# Patient Record
Sex: Female | Born: 1980
Health system: Southern US, Community
[De-identification: ages and names within clinical notes are randomized; demographics above are authoritative.]

## PROBLEM LIST (undated history)

## (undated) DIAGNOSIS — R102 Pelvic and perineal pain unspecified side: Secondary | ICD-10-CM

## (undated) DIAGNOSIS — Z9889 Other specified postprocedural states: Secondary | ICD-10-CM

## (undated) DIAGNOSIS — F909 Attention-deficit hyperactivity disorder, unspecified type: Secondary | ICD-10-CM

## (undated) DIAGNOSIS — F329 Major depressive disorder, single episode, unspecified: Secondary | ICD-10-CM

## (undated) DIAGNOSIS — F419 Anxiety disorder, unspecified: Secondary | ICD-10-CM

## (undated) DIAGNOSIS — R1032 Left lower quadrant pain: Secondary | ICD-10-CM

## (undated) DIAGNOSIS — N7011 Chronic salpingitis: Secondary | ICD-10-CM

## (undated) DIAGNOSIS — N857 Hematometra: Secondary | ICD-10-CM

## (undated) DIAGNOSIS — J302 Other seasonal allergic rhinitis: Secondary | ICD-10-CM

## (undated) DIAGNOSIS — Z973 Presence of spectacles and contact lenses: Secondary | ICD-10-CM

## (undated) HISTORY — PX: LAPAROSCOPIC CHOLECYSTECTOMY: SUR755

---

## 1999-03-12 ENCOUNTER — Other Ambulatory Visit: Admission: RE | Admit: 1999-03-12 | Discharge: 1999-03-12 | Payer: Self-pay | Admitting: *Deleted

## 2000-09-15 ENCOUNTER — Other Ambulatory Visit: Admission: RE | Admit: 2000-09-15 | Discharge: 2000-09-15 | Payer: Self-pay | Admitting: *Deleted

## 2001-03-24 ENCOUNTER — Other Ambulatory Visit: Admission: RE | Admit: 2001-03-24 | Discharge: 2001-03-24 | Payer: Self-pay | Admitting: Obstetrics and Gynecology

## 2001-11-03 ENCOUNTER — Other Ambulatory Visit: Admission: RE | Admit: 2001-11-03 | Discharge: 2001-11-03 | Payer: Self-pay | Admitting: Obstetrics and Gynecology

## 2002-04-26 ENCOUNTER — Other Ambulatory Visit: Admission: RE | Admit: 2002-04-26 | Discharge: 2002-04-26 | Payer: Self-pay | Admitting: Obstetrics and Gynecology

## 2006-12-29 ENCOUNTER — Ambulatory Visit (HOSPITAL_COMMUNITY): Admission: RE | Admit: 2006-12-29 | Discharge: 2006-12-29 | Payer: Self-pay | Admitting: Obstetrics and Gynecology

## 2007-01-25 ENCOUNTER — Ambulatory Visit (HOSPITAL_COMMUNITY): Admission: RE | Admit: 2007-01-25 | Discharge: 2007-01-25 | Payer: Self-pay | Admitting: *Deleted

## 2007-03-01 ENCOUNTER — Ambulatory Visit (HOSPITAL_COMMUNITY): Admission: RE | Admit: 2007-03-01 | Discharge: 2007-03-01 | Payer: Self-pay | Admitting: *Deleted

## 2007-03-01 ENCOUNTER — Ambulatory Visit: Payer: Self-pay | Admitting: Obstetrics & Gynecology

## 2007-03-04 ENCOUNTER — Ambulatory Visit: Payer: Self-pay | Admitting: Obstetrics & Gynecology

## 2007-03-08 ENCOUNTER — Ambulatory Visit: Payer: Self-pay | Admitting: Obstetrics & Gynecology

## 2007-03-11 ENCOUNTER — Ambulatory Visit: Payer: Self-pay | Admitting: Gynecology

## 2007-03-15 ENCOUNTER — Ambulatory Visit: Payer: Self-pay | Admitting: Gynecology

## 2007-03-18 ENCOUNTER — Inpatient Hospital Stay (HOSPITAL_COMMUNITY): Admission: AD | Admit: 2007-03-18 | Discharge: 2007-03-18 | Payer: Self-pay | Admitting: Obstetrics and Gynecology

## 2007-03-22 ENCOUNTER — Ambulatory Visit: Payer: Self-pay | Admitting: Obstetrics and Gynecology

## 2007-03-25 ENCOUNTER — Ambulatory Visit: Payer: Self-pay | Admitting: Obstetrics and Gynecology

## 2007-03-29 ENCOUNTER — Ambulatory Visit: Payer: Self-pay | Admitting: Obstetrics and Gynecology

## 2007-04-01 ENCOUNTER — Ambulatory Visit: Payer: Self-pay | Admitting: Gynecology

## 2007-04-05 ENCOUNTER — Ambulatory Visit: Payer: Self-pay | Admitting: Obstetrics & Gynecology

## 2007-04-08 ENCOUNTER — Ambulatory Visit: Payer: Self-pay | Admitting: Obstetrics and Gynecology

## 2007-04-12 ENCOUNTER — Ambulatory Visit: Payer: Self-pay | Admitting: Obstetrics & Gynecology

## 2007-04-15 ENCOUNTER — Encounter (INDEPENDENT_AMBULATORY_CARE_PROVIDER_SITE_OTHER): Payer: Self-pay | Admitting: Obstetrics and Gynecology

## 2007-04-15 ENCOUNTER — Inpatient Hospital Stay (HOSPITAL_COMMUNITY): Admission: RE | Admit: 2007-04-15 | Discharge: 2007-04-18 | Payer: Self-pay | Admitting: Obstetrics and Gynecology

## 2007-05-23 ENCOUNTER — Emergency Department (HOSPITAL_COMMUNITY): Admission: EM | Admit: 2007-05-23 | Discharge: 2007-05-23 | Payer: Self-pay | Admitting: Emergency Medicine

## 2007-06-03 ENCOUNTER — Encounter (INDEPENDENT_AMBULATORY_CARE_PROVIDER_SITE_OTHER): Payer: Self-pay | Admitting: Surgery

## 2007-06-03 ENCOUNTER — Ambulatory Visit (HOSPITAL_COMMUNITY): Admission: RE | Admit: 2007-06-03 | Discharge: 2007-06-04 | Payer: Self-pay | Admitting: Surgery

## 2007-06-03 HISTORY — PX: LAPAROSCOPIC CHOLECYSTECTOMY: SUR755

## 2008-09-03 ENCOUNTER — Encounter: Admission: RE | Admit: 2008-09-03 | Discharge: 2008-09-03 | Payer: Self-pay | Admitting: Family Medicine

## 2008-11-27 ENCOUNTER — Ambulatory Visit (HOSPITAL_COMMUNITY): Admission: RE | Admit: 2008-11-27 | Discharge: 2008-11-27 | Payer: Self-pay | Admitting: Obstetrics and Gynecology

## 2009-01-10 IMAGING — RF DG CHOLANGIOGRAM OPERATIVE
1 series · 12 of 12 positions shown · non-contrast
Comparison: none

CLINICAL DATA: Gallstones.
INTRAOPERATIVE CHOLANGIOGRAM:

[Series 1: run · 6 acquisitions, 12 frames shown]
[im 1/6]
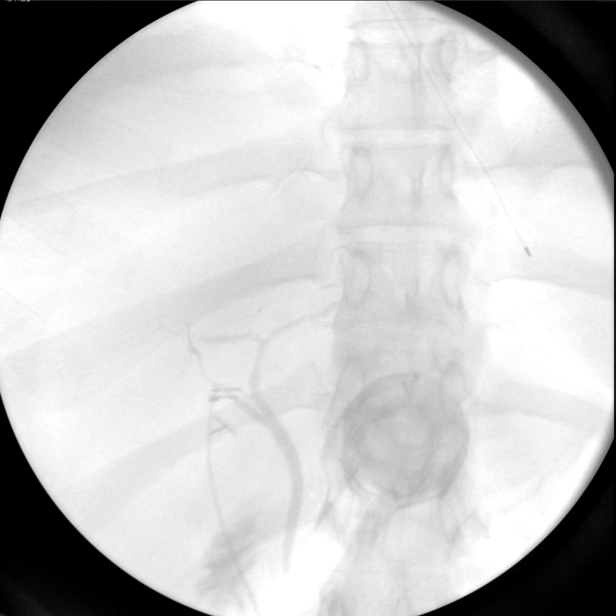
[im 1/6]
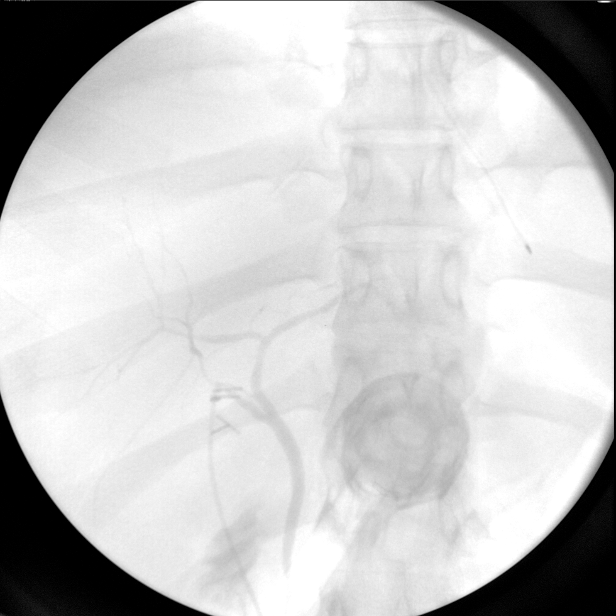
[im 1/6]
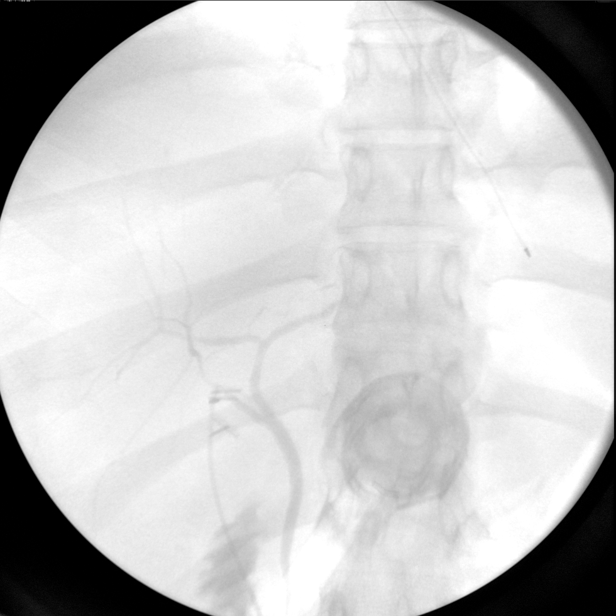
[im 1/6]
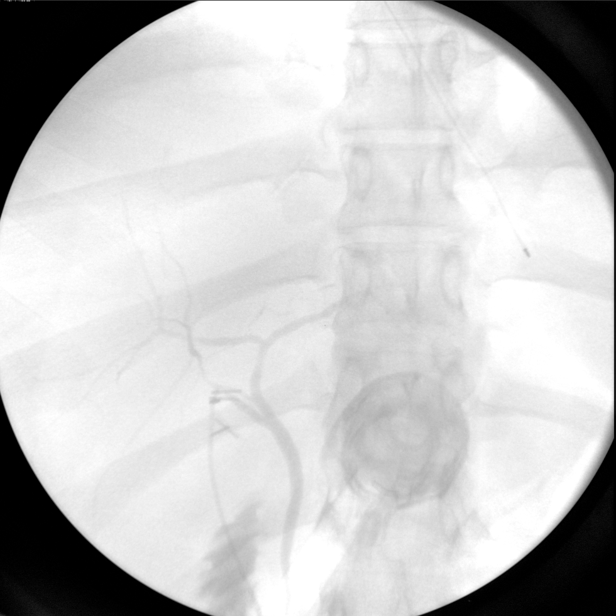
[im 2/6]
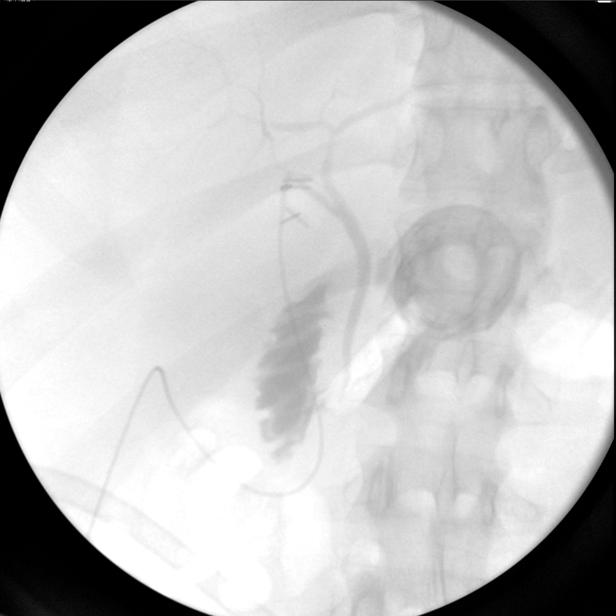
[im 2/6]
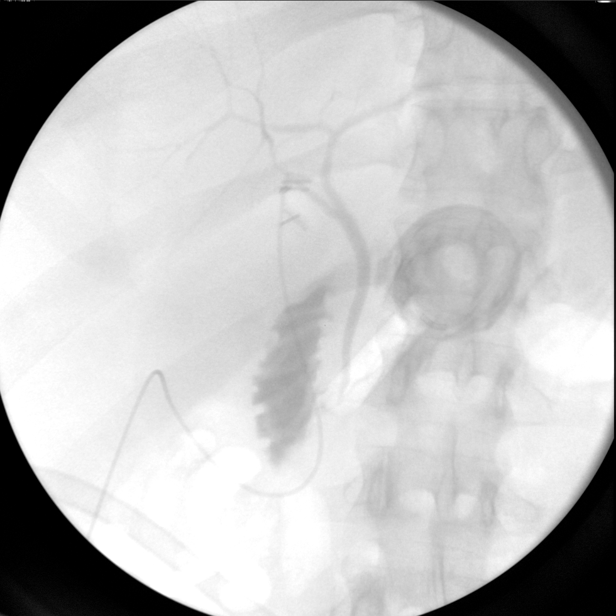
[im 2/6]
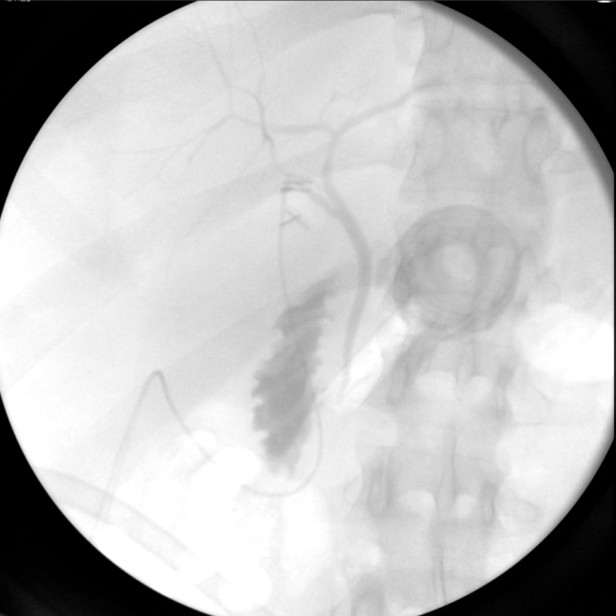
[im 2/6]
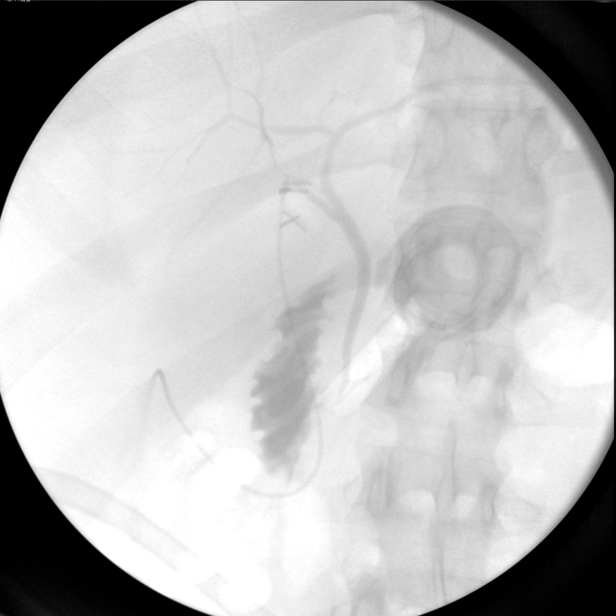
[im 3/6]
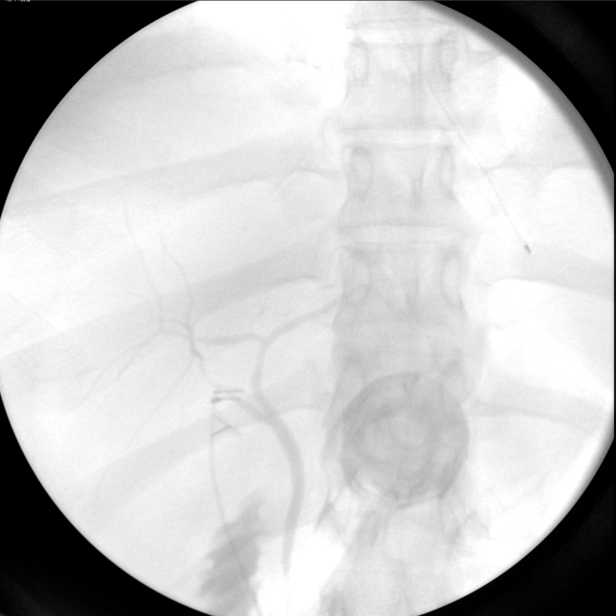
[im 4/6]
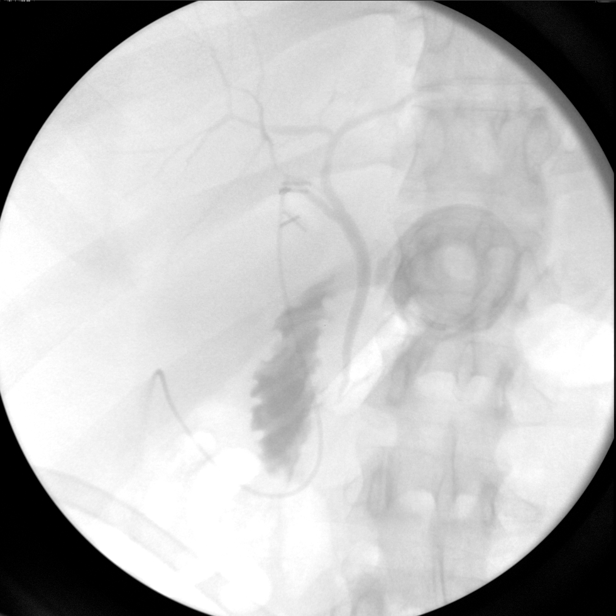
[im 5/6]
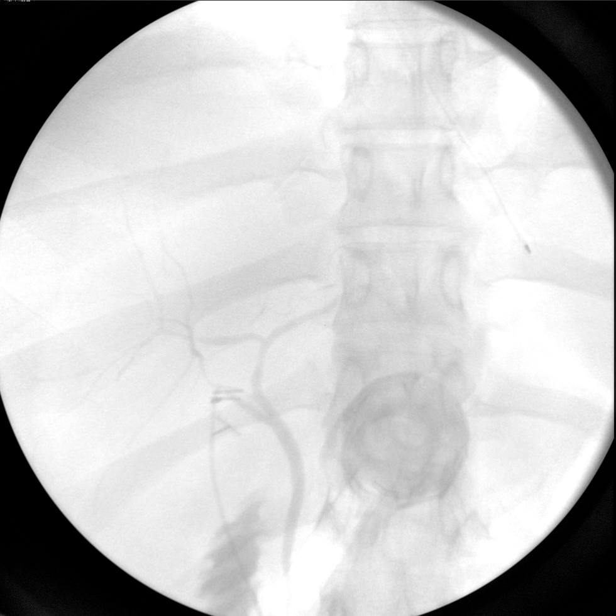
[im 6/6]
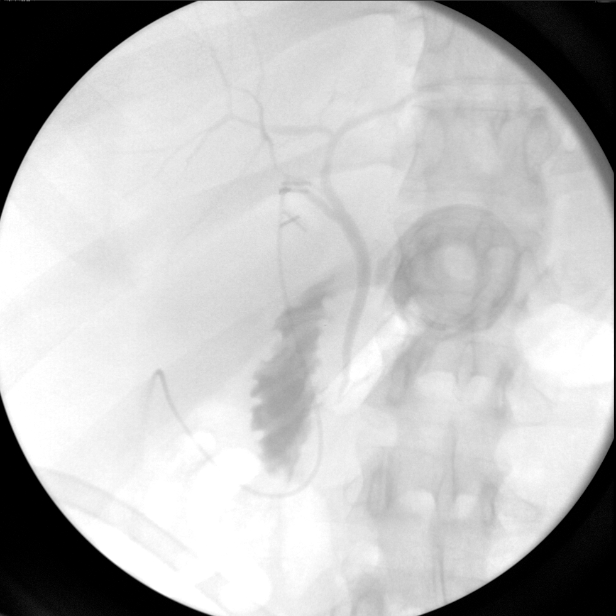

[12 of 12 positions shown; findings below may reference images not displayed]

FINDINGS: The gallbladder has been removed and the cystic duct is cannulated.  There is opacification of the intrahepatic and extrahepatic bile ducts.  There appears to be an accessory duct or small right hepatic duct that drains just proximal to the cystic duct and has small branches in the liver.  No evidence for biliary dilatation.  There is filling of the duodenum.
IMPRESSION: 1.  Patent biliary system without dilatation.
2.  Small accessory or right hepatic duct as described.

## 2009-01-15 ENCOUNTER — Ambulatory Visit (HOSPITAL_COMMUNITY): Admission: RE | Admit: 2009-01-15 | Discharge: 2009-01-15 | Payer: Self-pay | Admitting: Obstetrics and Gynecology

## 2009-02-12 ENCOUNTER — Ambulatory Visit (HOSPITAL_COMMUNITY): Admission: RE | Admit: 2009-02-12 | Discharge: 2009-02-12 | Payer: Self-pay | Admitting: Obstetrics and Gynecology

## 2009-04-02 ENCOUNTER — Encounter (INDEPENDENT_AMBULATORY_CARE_PROVIDER_SITE_OTHER): Payer: Self-pay | Admitting: Obstetrics and Gynecology

## 2009-04-02 ENCOUNTER — Inpatient Hospital Stay (HOSPITAL_COMMUNITY): Admission: RE | Admit: 2009-04-02 | Discharge: 2009-04-05 | Payer: Self-pay | Admitting: Obstetrics and Gynecology

## 2010-04-14 ENCOUNTER — Encounter: Payer: Self-pay | Admitting: Obstetrics and Gynecology

## 2010-06-08 LAB — CBC
HCT: 29.7 % — ABNORMAL LOW (ref 36.0–46.0)
HCT: 36.5 % (ref 36.0–46.0)
Hemoglobin: 10.2 g/dL — ABNORMAL LOW (ref 12.0–15.0)
Hemoglobin: 12.5 g/dL (ref 12.0–15.0)
MCHC: 34.3 g/dL (ref 30.0–36.0)
MCV: 94.8 fL (ref 78.0–100.0)
MCV: 94.9 fL (ref 78.0–100.0)
Platelets: 157 10*3/uL (ref 150–400)
RBC: 3.85 MIL/uL — ABNORMAL LOW (ref 3.87–5.11)
RDW: 13.1 % (ref 11.5–15.5)
WBC: 6.8 10*3/uL (ref 4.0–10.5)

## 2010-08-05 NOTE — Op Note (Signed)
NAMEMAYDELL, KNOEBEL             ACCOUNT NO.:  [id number]   MEDICAL RECORD NO.:  [id number]          PATIENT TYPE:  INP   LOCATION:  9135                          FACILITY:  [facility]   PHYSICIAN:  Lenoard Aden, M.D.DATE OF BIRTH:  May 18, 1980   DATE OF PROCEDURE:  04/15/2007  DATE OF DISCHARGE:                               OPERATIVE REPORT   PREOPERATIVE DIAGNOSES:  1. Twins.  2. At 37 weeks.  3. Malpresentation twin A.  4. Discordant growth.   POSTOPERATIVE DIAGNOSES:  1. Twins.  2. At 37 weeks.  3. Malpresentation twin A.  4. Discordant growth.   PROCEDURE:  Primary low segment transverse cesarean section.   SURGEON:  Lenoard Aden, M.D.   ASSISTANT:  Marlinda Mike, C.N.M.   ANESTHESIA:  Spinal by Quillian Quince, M.D.   ESTIMATED BLOOD LOSS:  1000 mL.   COMPLICATIONS:  None.   DRAIN:  Foley.   COUNTS:  Correct.   Patient to recovery in good condition.   FINDINGS:  Full-term living female twin A breech, B transverse, anterior  and posterior placentas noted.  Apgars 7 and 9 A and B.  Two-layer  closure.  Normal tubes, normal ovaries.   BRIEF OPERATIVE NOTE:  After being apprised of the risks of anesthesia,  infection, bleeding, injury to abdominal organs with need for repair,  delayed versus immediate complications to include bowel and bladder  injury, patient is brought to the operating room where she was  administered spinal anesthetic without complications, prepped and draped  in usual sterile fashion.  Foley catheter placed after achieving  adequate anesthesia, dilute Marcaine solution placed.  Pfannenstiel skin  incision made with scalpel, carried down to the fascia which was nicked  in the midline of the transverse using Mayo scissors.  Rectus muscles  dissected sharply in the midline, peritoneum entered sharply.  Bladder  blade placed.  Visceral peritoneum scored sharply off the lower uterine  segment.  Kerr hysterotomy incision made.  Amniotomy  with clear fluid.  Atraumatic delivery of twin A female from the breech position in the  right lower quadrant, handed pediatricians in attendance.  Apgars 7 and  7.  Cord blood collected.  Amniotomy clear, B delivered from a footling  breech position, Apgars 7 and 9.  Pediatricians in attendance.  Cord  blood collected.  Bulb suctioning performed on both babies.  Placentas  delivered intact three-vessel cord.  Uterus curetted using a dry lap  pack, exteriorized and closed in two running layers of 0 Monocryl  suture.  Good hemostasis is noted.  Bladder flap inspected and found to  be hemostatic.  Irrigation accomplished.  Fascia closed using 0 Monocryl  in continuous running fashion.  Subcutaneous tissue reapproximated using  2-0 plain.  Skin closed using staples.  The patient tolerated procedure  well and was transferred to recovery in good condition.      Lenoard Aden, M.D.  Electronically Signed     RJT/MEDQ  D:  04/15/2007  T:  04/15/2007  Job:  [id number]

## 2010-08-05 NOTE — Op Note (Signed)
NAMEJALIYA, SIEGMANN             ACCOUNT NO.:  [id number]   MEDICAL RECORD NO.:  [id number]          PATIENT TYPE:  INP   LOCATION:  5118                         FACILITY:  [facility]   PHYSICIAN:  Currie Paris, M.D.DATE OF BIRTH:  04-19-1980   DATE OF PROCEDURE:  06/03/2007  DATE OF DISCHARGE:  06/04/2007                               OPERATIVE REPORT   CCS [id number].   PREOPERATIVE DIAGNOSIS:  Chronic calculus cholecystitis.   POSTOPERATIVE DIAGNOSIS:  Chronic calculus cholecystitis with finding of  accessory right hepatic duct draining into cystic duct.   OPERATION:  Laparoscopic cholecystectomy with operative cholangiogram.   SURGEON:  Dr. Jamey Ripa   ASSISTANT:  Dr. Lurene Shadow   ANESTHESIA:  General endotracheal.   CLINICAL HISTORY:  This lady is 4 weeks status post delivery of twins  who has began having biliary symptoms and was found to have gallstones.  She elected to proceed to cholecystectomy.   DESCRIPTION OF PROCEDURE:  The patient was seen in the holding area, and  she had no further questions.  We confirmed that cholecystectomy was the  planned procedure.   She was taken to the operating room and after satisfactory general  endotracheal anesthesia had been obtained, the abdomen was prepped and  draped.  The time-out was performed.   Marcaine 0.25% plain was used for each incision.  The umbilical incision  was made, the fascia identified and opened and the peritoneal cavity  entered under direct vision.  The Hasson was introduced and the abdomen  insufflated to 15.  There were no gross abnormalities noted.  There were  a few adhesions on the gallbladder.   A 10/11 trocar was placed in the epigastrium, two 5 mm laterally.  The  gallbladder was retracted up over the liver, and I began dissection  around the cystic duct and opened the peritoneum and found a long  segment of cystic duct and also could see the cystic artery coming up  along side and almost attached,  but I was able to dissect that off so we  had a nice segment of cystic duct.  Initially, I clipped the cystic  artery once and then the cystic duct at its junction with the  gallbladder.  I placed a Cook catheter percutaneously and then opened  the cystic duct and threaded the Cook catheter in and held with a single  clip.   The cholangiogram was done and showed good filling of the common duct  and duodenum and hepatic radicals.  However, there appeared to be an  accessory duct coming off of the cystic duct into a small segment of the  right lobe of the liver.  Once I had the cholangiogram done, I went  ahead and removed the catheter and closely inspected the cystic duct and  found what appeared to be a ductal-looking structure that was extremely  tiny coming into the cystic duct right where I had cut it and not  leaving me enough room really to get a good clip across to occlude the  cystic duct.  I therefore clipped the cystic duct in a more normal  position and then clipped this accessory duct, hoping that it would then  close off.   The gallbladder was then checked and saw no other abnormalities.  I put  some more clips on the cystic artery and divided it and removed the  gallbladder from below to above with additional clips being placed in  what looked like posterior cystic artery.  Once the gallbladder was  removed, I irrigated and made sure everything was dry.  I put the  gallbladder in a bag and brought it out through the umbilical port.  I  took another look in the area of the cystic duct and saw no evidence of  either bleeding or bile leaks but because of this abnormal anatomy and  the accessory duct, I elected to put a drain in in case there was a  postoperative bile leak.   This was done by placing a 19 Blake through the epigastric port and  bringing it out the lateral site and placing it along the patch Douglas  and posterior to the area where I thought the leak could  occur if one  did.   Once everything had been irrigated, I removed the lateral ports.  I  closed the umbilical port with the pursestring.  I deflated the abdomen  through the epigastric port and then closed all skin with 4-0 Monocryl  subcuticular and Dermabond.   The patient tolerated the procedure well.  There were no operative  complications.  All counts were correct.      Currie Paris, M.D.  Electronically Signed     CJS/MEDQ  D:  06/03/2007  T:  06/04/2007  Job:  [id number]   cc:   Lenoard Aden, M.D.

## 2010-08-05 NOTE — Discharge Summary (Signed)
NAMEMORRISON, MASSER             ACCOUNT NO.:  [id number]   MEDICAL RECORD NO.:  [id number]          PATIENT TYPE:  INP   LOCATION:  9135                          FACILITY:  [facility]   PHYSICIAN:  Lenoard Aden, M.D.DATE OF BIRTH:  March 31, 1980   DATE OF ADMISSION:  04/15/2007  DATE OF DISCHARGE:  04/18/2007                               DISCHARGE SUMMARY   HISTORY OF PRESENT ILLNESS:  The patient underwent uncomplicated primary  C-section for twins with malpresentation of twin A at 37 weeks with  discordant growth.  Postoperative course uncomplicated.  Tolerating diet  well.  Discharged to home day 3.   DISCHARGE INSTRUCTIONS:  Discharge teaching was done.   DISCHARGE MEDICATIONS:  Tylox, prenatal vitamins, and iron given.   FOLLOWUP:  Follow up in the office in 4-6 weeks.      Lenoard Aden, M.D.  Electronically Signed     RJT/MEDQ  D:  06/03/2007  T:  06/04/2007  Job:  [id number]

## 2010-12-11 LAB — RPR: RPR Ser Ql: NONREACTIVE

## 2010-12-11 LAB — CBC
Hemoglobin: 9.8 — ABNORMAL LOW
MCHC: 34.8
MCV: 94.8
Platelets: 161
RBC: 3.64 — ABNORMAL LOW
RDW: 14.3
RDW: 14.7

## 2010-12-15 LAB — DIFFERENTIAL
Basophils Absolute: 0
Basophils Relative: 0
Basophils Relative: 1
Eosinophils Relative: 2
Eosinophils Relative: 3
Lymphs Abs: 1.3
Monocytes Absolute: 0.3
Neutro Abs: 2.3
Neutrophils Relative %: 69

## 2010-12-15 LAB — CBC
HCT: 34.2 — ABNORMAL LOW
HCT: 36.6
Hemoglobin: 11.8 — ABNORMAL LOW
MCHC: 34.5
MCHC: 35.4
MCV: 89.4
Platelets: 166
Platelets: 196
RBC: 4.12
RDW: 13.3
RDW: 13.3
WBC: 4.3

## 2010-12-15 LAB — COMPREHENSIVE METABOLIC PANEL
AST: 25
Albumin: 3.8
Albumin: 3.8
Alkaline Phosphatase: 58
Alkaline Phosphatase: 70
BUN: 14
BUN: 6
BUN: 8
CO2: 25
Calcium: 8.9
Chloride: 106
Chloride: 112
GFR calc Af Amer: >60
Glucose, Bld: 110 — ABNORMAL HIGH
Glucose, Bld: 112 — ABNORMAL HIGH
Potassium: 3.9
Potassium: 4.1
Potassium: 4.1
Total Bilirubin: 0.4
Total Protein: 5.5 — ABNORMAL LOW
Total Protein: 6.5

## 2010-12-15 LAB — URINALYSIS, ROUTINE W REFLEX MICROSCOPIC
Bilirubin Urine: NEGATIVE
Glucose, UA: NEGATIVE
Ketones, ur: NEGATIVE
Ketones, ur: NEGATIVE
Nitrite: NEGATIVE
Nitrite: NEGATIVE
Protein, ur: NEGATIVE
Urobilinogen, UA: 0.2
Urobilinogen, UA: 0.2

## 2010-12-15 LAB — POCT PREGNANCY, URINE: Operator id: 277751

## 2010-12-15 LAB — URINE MICROSCOPIC-ADD ON

## 2011-06-02 HISTORY — PX: HYSTEROSCOPY W/ ENDOMETRIAL ABLATION: SUR665

## 2012-11-26 ENCOUNTER — Emergency Department (INDEPENDENT_AMBULATORY_CARE_PROVIDER_SITE_OTHER): Payer: BC Managed Care – PPO

## 2012-11-26 ENCOUNTER — Encounter (HOSPITAL_COMMUNITY): Payer: Self-pay | Admitting: *Deleted

## 2012-11-26 ENCOUNTER — Emergency Department (HOSPITAL_COMMUNITY)
Admission: EM | Admit: 2012-11-26 | Discharge: 2012-11-26 | Disposition: A | Discharge status: Home / Self Care | Payer: BC Managed Care – PPO | Source: Home / Self Care | Attending: Family Medicine | Admitting: Family Medicine

## 2012-11-26 DIAGNOSIS — S93601A Unspecified sprain of right foot, initial encounter: Secondary | ICD-10-CM

## 2012-11-26 DIAGNOSIS — S93609A Unspecified sprain of unspecified foot, initial encounter: Secondary | ICD-10-CM

## 2012-11-26 MED ORDER — HYDROCODONE-ACETAMINOPHEN 5-325 MG PO TABS: 1.0000 | ORAL_TABLET | Freq: Four times a day (QID) | ORAL | Status: DC | PRN

## 2012-11-26 NOTE — ED Provider Notes (Signed)
CSN: [id number]     Arrival date & time 11/26/12  1843 History   First MD Initiated Contact with Patient 11/26/12 1859     Chief Complaint  Patient presents with  . Foot Injury   (Consider location/radiation/quality/duration/timing/severity/associated sxs/prior Treatment) Patient is a 32 y.o. female presenting with foot injury. The history is provided by the patient.  Foot Injury Location:  Foot Time since incident:  8 hours Injury: yes   Mechanism of injury comment:  Running after kids and felt pop in right foot plantar surface. Foot location:  Sole of R foot Pain details:    Quality:  Burning   Radiates to:  Does not radiate   Severity:  Moderate   Onset quality:  Sudden   Progression:  Unchanged Chronicity:  New Dislocation: no   Tetanus status:  Out of date Prior injury to area:  No   History reviewed. No pertinent past medical history. History reviewed. No pertinent past surgical history. History reviewed. No pertinent family history. History  Substance Use Topics  . Smoking status: Current Every Day Smoker  . Smokeless tobacco: Not on file  . Alcohol Use: No   OB History   Grav Para Term Preterm Abortions TAB SAB Ect Mult Living                 Review of Systems  Constitutional: Negative.   Musculoskeletal: Positive for gait problem. Negative for joint swelling.    Allergies  Review of patient's allergies indicates no known allergies.  Home Medications  No current outpatient prescriptions on file. BP 124/70  Pulse 72  Temp(Src) 98.6 F (37 C) (Oral)  Resp 16  SpO2 100% Physical Exam  Nursing note and vitals reviewed. Constitutional: She is oriented to person, place, and time. She appears well-developed and well-nourished.  Musculoskeletal: She exhibits tenderness.       Right foot: She exhibits tenderness. She exhibits no bony tenderness, no swelling, normal capillary refill and no crepitus.       Feet:  Neurological: She is alert and oriented to  person, place, and time.  Skin: Skin is warm and dry.    ED Course  Procedures (including critical care time) Labs Review Labs Reviewed - No data to display Imaging Review No results found.  MDM      Linna Hoff, MD 11/26/12 [phone]

## 2012-11-26 NOTE — ED Notes (Signed)
Pt  Reports  Injured  Her  l  Foot  Playing  Soccer  Today        She    Reports   She  Felt a  Pop    -  She  Has  Pain  Which  Is  Worse  On  Weight  Bearing

## 2012-12-23 ENCOUNTER — Other Ambulatory Visit: Payer: Self-pay | Admitting: Dermatology

## 2013-03-23 HISTORY — PX: KNEE ARTHROSCOPY W/ ACL RECONSTRUCTION: SHX1858

## 2014-04-10 ENCOUNTER — Other Ambulatory Visit: Payer: Self-pay | Admitting: Orthopedic Surgery

## 2014-04-10 DIAGNOSIS — M25511 Pain in right shoulder: Secondary | ICD-10-CM

## 2014-04-21 ENCOUNTER — Ambulatory Visit
Admission: RE | Admit: 2014-04-21 | Discharge: 2014-04-21 | Disposition: A | Discharge status: Home / Self Care | Payer: 59 | Source: Ambulatory Visit | Attending: Orthopedic Surgery | Admitting: Orthopedic Surgery

## 2014-04-21 DIAGNOSIS — M25511 Pain in right shoulder: Secondary | ICD-10-CM

## 2016-04-01 DIAGNOSIS — Z79899 Other long term (current) drug therapy: Secondary | ICD-10-CM | POA: Diagnosis not present

## 2016-07-03 DIAGNOSIS — Z79899 Other long term (current) drug therapy: Secondary | ICD-10-CM | POA: Diagnosis not present

## 2016-10-28 DIAGNOSIS — Z79899 Other long term (current) drug therapy: Secondary | ICD-10-CM | POA: Diagnosis not present

## 2016-11-24 DIAGNOSIS — J069 Acute upper respiratory infection, unspecified: Secondary | ICD-10-CM | POA: Diagnosis not present

## 2016-12-02 DIAGNOSIS — H6690 Otitis media, unspecified, unspecified ear: Secondary | ICD-10-CM | POA: Diagnosis not present

## 2016-12-02 DIAGNOSIS — J45909 Unspecified asthma, uncomplicated: Secondary | ICD-10-CM | POA: Diagnosis not present

## 2018-05-23 ENCOUNTER — Encounter (HOSPITAL_COMMUNITY): Payer: Self-pay | Admitting: Emergency Medicine

## 2018-05-23 ENCOUNTER — Emergency Department (HOSPITAL_COMMUNITY): Payer: 59

## 2018-05-23 ENCOUNTER — Other Ambulatory Visit: Payer: Self-pay

## 2018-05-23 ENCOUNTER — Emergency Department (HOSPITAL_COMMUNITY)
Admission: EM | Admit: 2018-05-23 | Discharge: 2018-05-23 | Disposition: A | Discharge status: Home / Self Care | Payer: 59 | Attending: Emergency Medicine | Admitting: Emergency Medicine

## 2018-05-23 ENCOUNTER — Other Ambulatory Visit: Payer: Self-pay | Admitting: Certified Nurse Midwife

## 2018-05-23 DIAGNOSIS — F17210 Nicotine dependence, cigarettes, uncomplicated: Secondary | ICD-10-CM | POA: Diagnosis not present

## 2018-05-23 DIAGNOSIS — R109 Unspecified abdominal pain: Secondary | ICD-10-CM | POA: Insufficient documentation

## 2018-05-23 DIAGNOSIS — N83201 Unspecified ovarian cyst, right side: Secondary | ICD-10-CM | POA: Diagnosis not present

## 2018-05-23 LAB — BASIC METABOLIC PANEL
ANION GAP: 5 (ref 5–15)
BUN: 9 mg/dL (ref 6–20)
CALCIUM: 8.8 mg/dL — AB (ref 8.9–10.3)
CHLORIDE: 110 mmol/L (ref 98–111)
CO2: 24 mmol/L (ref 22–32)
Creatinine, Ser: 0.8 mg/dL (ref 0.44–1.00)
GFR calc non Af Amer: >60 mL/min (ref >60)
Glucose, Bld: 105 mg/dL — ABNORMAL HIGH (ref 70–99)
POTASSIUM: 4.5 mmol/L (ref 3.5–5.1)
Sodium: 139 mmol/L (ref 135–145)

## 2018-05-23 LAB — CBC WITH DIFFERENTIAL/PLATELET
ABS IMMATURE GRANULOCYTES: 0.02 10*3/uL (ref 0.00–0.07)
BASOS ABS: 0.1 10*3/uL (ref 0.0–0.1)
Basophils Relative: 1 %
EOS ABS: 0.1 10*3/uL (ref 0.0–0.5)
Eosinophils Relative: 2 %
HEMATOCRIT: 44.1 % (ref 36.0–46.0)
Hemoglobin: 14.4 g/dL (ref 12.0–15.0)
IMMATURE GRANULOCYTES: 0 %
LYMPHS PCT: 25 %
Lymphs Abs: 1.5 10*3/uL (ref 0.7–4.0)
MCH: 30.5 pg (ref 26.0–34.0)
MCHC: 32.7 g/dL (ref 30.0–36.0)
MCV: 93.4 fL (ref 80.0–100.0)
Monocytes Absolute: 0.4 10*3/uL (ref 0.1–1.0)
Monocytes Relative: 6 %
NEUTROS ABS: 4 10*3/uL (ref 1.7–7.7)
NEUTROS PCT: 66 %
NRBC: 0 % (ref 0.0–0.2)
PLATELETS: 176 10*3/uL (ref 150–400)
RBC: 4.72 MIL/uL (ref 3.87–5.11)
RDW: 12.2 % (ref 11.5–15.5)
WBC: 6.1 10*3/uL (ref 4.0–10.5)

## 2018-05-23 LAB — URINALYSIS, ROUTINE W REFLEX MICROSCOPIC
Bilirubin Urine: NEGATIVE
GLUCOSE, UA: NEGATIVE mg/dL
Ketones, ur: NEGATIVE mg/dL
Leukocytes,Ua: NEGATIVE
NITRITE: NEGATIVE
PH: 7 (ref 5.0–8.0)
Protein, ur: NEGATIVE mg/dL
Specific Gravity, Urine: 1.011 (ref 1.005–1.030)

## 2018-05-23 LAB — I-STAT BETA HCG BLOOD, ED (MC, WL, AP ONLY): I-stat hCG, quantitative: <5 m[IU]/mL (ref <5)

## 2018-05-23 MED ORDER — KETOROLAC TROMETHAMINE 30 MG/ML IJ SOLN
30.0000 mg | Freq: Once | INTRAMUSCULAR | Status: AC
Start: 2018-05-23 — End: 2018-05-23
  Administered 2018-05-23: 30 mg via INTRAVENOUS
  Filled 2018-05-23: qty 1

## 2018-05-23 MED ORDER — METHOCARBAMOL 500 MG PO TABS: 1000.0000 mg | ORAL_TABLET | Freq: Four times a day (QID) | ORAL | 0 refills | Status: DC | PRN

## 2018-05-23 MED ORDER — MORPHINE SULFATE (PF) 4 MG/ML IV SOLN
4.0000 mg | INTRAVENOUS | Status: AC | PRN
  Administered 2018-05-23 (×2): 4 mg via INTRAVENOUS
  Filled 2018-05-23 (×2): qty 1

## 2018-05-23 MED ORDER — FENTANYL CITRATE (PF) 100 MCG/2ML IJ SOLN
50.0000 ug | INTRAMUSCULAR | Status: AC | PRN
  Administered 2018-05-23 (×2): 50 ug via INTRAVENOUS
  Filled 2018-05-23 (×2): qty 2

## 2018-05-23 MED ORDER — ONDANSETRON HCL 4 MG/2ML IJ SOLN
4.0000 mg | INTRAMUSCULAR | Status: DC | PRN
  Administered 2018-05-23: 4 mg via INTRAVENOUS
  Filled 2018-05-23: qty 2

## 2018-05-23 MED ORDER — CEPHALEXIN 500 MG PO CAPS: 500.0000 mg | ORAL_CAPSULE | Freq: Four times a day (QID) | ORAL | 0 refills | Status: DC

## 2018-05-23 MED ORDER — OXYCODONE-ACETAMINOPHEN 5-325 MG PO TABS: ORAL_TABLET | ORAL | 0 refills | Status: DC

## 2018-05-23 MED ORDER — OXYCODONE-ACETAMINOPHEN 5-325 MG PO TABS
2.0000 | ORAL_TABLET | Freq: Once | ORAL | Status: AC
  Administered 2018-05-23: 2 via ORAL
  Filled 2018-05-23: qty 2

## 2018-05-23 MED ORDER — METHOCARBAMOL 500 MG PO TABS
1000.0000 mg | ORAL_TABLET | Freq: Once | ORAL | Status: AC
  Administered 2018-05-23: 1000 mg via ORAL
  Filled 2018-05-23: qty 2

## 2018-05-23 MED ORDER — FENTANYL CITRATE (PF) 100 MCG/2ML IJ SOLN
100.0000 ug | INTRAMUSCULAR | Status: DC | PRN
  Filled 2018-05-23: qty 2

## 2018-05-23 NOTE — ED Notes (Signed)
US at bedside

## 2018-05-23 NOTE — ED Triage Notes (Signed)
Pt c/o left flank pain that radiates around to left lower abd that started this morning. Pt denies n/v/d or urinary problems.

## 2018-05-23 NOTE — Discharge Instructions (Signed)
Your pelvic ultrasound showed an incidental finding(s): "1) Focal fluid collection within the LEFT cornual region, possibly representing entrapped endometrial fluid from prior ablation or synechiae. 2) 2.1 centimeters simple RIGHT ovarian cyst. 3) LEFT hydrosalpinx.  LEFT ovary is normal." Take the prescriptions as directed.  Apply moist heat or ice to the area(s) of discomfort, for 15 minutes at a time, several times per day for the next few days.  Do not fall asleep on a heating or ice pack.  Call your regular medical doctor and OB/GYN doctor today to schedule a follow up appointment this week.  Return to the Emergency Department immediately if worsening.

## 2018-05-23 NOTE — ED Provider Notes (Signed)
[facility] DEPT Provider Note   CSN: [id number] Arrival date & time: 05/23/18  3419    History   Chief Complaint Chief Complaint  Patient presents with  . Flank Pain  . Abdominal Pain    HPI Wendy Hicks is a 38 y.o. female.     HPI  Pt was seen at [place]. Per pt, c/o sudden onset and persistence of waxing and waning left sided flank "pain" that began yesterday morning, constant since 0300 this morning.  Pt describes the pain as radiating into the left side of her abd.  Has been associated with no other symptoms. Denies vaginal bleeding/discharge, no dysuria/hematuria, no abd pain, no N/V, no diarrhea, no CP/SOB, no fevers, no rash.    History reviewed. No pertinent past medical history.  There are no active problems to display for this patient.   History reviewed. No pertinent surgical history.   OB History   No obstetric history on file.      Home Medications    Prior to Admission medications   Medication Sig Start Date End Date Taking? Authorizing Provider  HYDROcodone-acetaminophen (NORCO/VICODIN) 5-325 MG per tablet Take 1 tablet by mouth every 6 (six) hours as needed for pain. 11/26/12   Billy Fischer, MD    Family History No family history on file.  Social History Social History   Tobacco Use  . Smoking status: Current Every Day Smoker    Types: Cigarettes  Substance Use Topics  . Alcohol use: No  . Drug use: Not on file     Allergies   Patient has no known allergies.   Review of Systems Review of Systems ROS: Statement: All systems negative except as marked or noted in the HPI; Constitutional: Negative for fever and chills. ; ; Eyes: Negative for eye pain, redness and discharge. ; ; ENMT: Negative for ear pain, hoarseness, nasal congestion, sinus pressure and sore throat. ; ; Cardiovascular: Negative for chest pain, palpitations, diaphoresis, dyspnea and peripheral edema. ; ; Respiratory: Negative for cough,  wheezing and stridor. ; ; Gastrointestinal: Negative for nausea, vomiting, diarrhea, abdominal pain, blood in stool, hematemesis, jaundice and rectal bleeding. . ; ; Genitourinary: +flank pain. Negative for dysuria and hematuria. ; ; GYN:  No pelvic pain, no vaginal bleeding, no vaginal discharge, no vulvar pain. ;; Musculoskeletal: Negative for back pain and neck pain. Negative for swelling and trauma.; ; Skin: Negative for pruritus, rash, abrasions, blisters, bruising and skin lesion.; ; Neuro: Negative for headache, lightheadedness and neck stiffness. Negative for weakness, altered level of consciousness, altered mental status, extremity weakness, paresthesias, involuntary movement, seizure and syncope.       Physical Exam Updated Vital Signs BP 137/82 (BP Location: Right Arm)   Pulse 100   Temp 98.1 F (36.7 C) (Oral)   Resp 20   Ht 5\' 7"  (1.702 m)   Wt 90.7 kg   SpO2 100%   BMI 31.32 kg/m   Physical Exam 0820: Physical examination:  Nursing notes reviewed; Vital signs and O2 SAT reviewed;  Constitutional: Well developed, Well nourished, Well hydrated, Uncomfortable appearing.; Head:  Normocephalic, atraumatic; Eyes: EOMI, PERRL, No scleral icterus; ENMT: Mouth and pharynx normal, Mucous membranes moist; Neck: Supple, Full range of motion, No lymphadenopathy; Cardiovascular: Regular rate and rhythm, No gallop; Respiratory: Breath sounds clear & equal bilaterally, No wheezes.  Speaking full sentences with ease, Normal respiratory effort/excursion; Chest: Nontender, Movement normal; Abdomen: Soft, Nontender, Nondistended, Normal bowel sounds; Genitourinary: No CVA tenderness; Extremities:  Peripheral pulses normal, No tenderness, No edema, No calf edema or asymmetry.; Neuro: AA&Ox3, Major CN grossly intact.  Speech clear. No gross focal motor or sensory deficits in extremities.; Skin: Color normal, Warm, Dry.   ED Treatments / Results  Labs (all labs ordered are listed, but only abnormal  results are displayed)   EKG None  Radiology   Procedures Procedures (including critical care time)  Medications Ordered in ED Medications  morphine 4 MG/ML injection 4 mg (has no administration in time range)  ondansetron (ZOFRAN) injection 4 mg (has no administration in time range)     Initial Impression / Assessment and Plan / ED Course  I have reviewed the triage vital signs and the nursing notes.  Pertinent labs & imaging results that were available during my care of the patient were reviewed by me and considered in my medical decision making (see chart for details).     MDM Reviewed: previous chart, nursing note and vitals Reviewed previous: labs Interpretation: labs, CT scan and ultrasound    Results for orders placed or performed during the hospital encounter of 81/85/63  Basic metabolic panel  Result Value Ref Range   Sodium 139 135 - 145 mmol/L   Potassium 4.5 3.5 - 5.1 mmol/L   Chloride 110 98 - 111 mmol/L   CO2 24 22 - 32 mmol/L   Glucose, Bld 105 (H) 70 - 99 mg/dL   BUN 9 6 - 20 mg/dL   Creatinine, Ser 0.80 0.44 - 1.00 mg/dL   Calcium 8.8 (L) 8.9 - 10.3 mg/dL   GFR calc non Af Amer >60 >60 mL/min   GFR calc Af Amer >60 >60 mL/min   Anion gap 5 5 - 15  CBC with Differential  Result Value Ref Range   WBC 6.1 4.0 - 10.5 K/uL   RBC 4.72 3.87 - 5.11 MIL/uL   Hemoglobin 14.4 12.0 - 15.0 g/dL   HCT 44.1 36.0 - 46.0 %   MCV 93.4 80.0 - 100.0 fL   MCH 30.5 26.0 - 34.0 pg   MCHC 32.7 30.0 - 36.0 g/dL   RDW 12.2 11.5 - 15.5 %   Platelets 176 150 - 400 K/uL   nRBC 0.0 0.0 - 0.2 %   Neutrophils Relative % 66 %   Neutro Abs 4.0 1.7 - 7.7 K/uL   Lymphocytes Relative 25 %   Lymphs Abs 1.5 0.7 - 4.0 K/uL   Monocytes Relative 6 %   Monocytes Absolute 0.4 0.1 - 1.0 K/uL   Eosinophils Relative 2 %   Eosinophils Absolute 0.1 0.0 - 0.5 K/uL   Basophils Relative 1 %   Basophils Absolute 0.1 0.0 - 0.1 K/uL   Immature Granulocytes 0 %   Abs Immature  Granulocytes 0.02 0.00 - 0.07 K/uL  Urinalysis, Routine w reflex microscopic  Result Value Ref Range   Color, Urine YELLOW YELLOW   APPearance HAZY (A) CLEAR   Specific Gravity, Urine 1.011 1.005 - 1.030   pH 7.0 5.0 - 8.0   Glucose, UA NEGATIVE NEGATIVE mg/dL   Hgb urine dipstick SMALL (A) NEGATIVE   Bilirubin Urine NEGATIVE NEGATIVE   Ketones, ur NEGATIVE NEGATIVE mg/dL   Protein, ur NEGATIVE NEGATIVE mg/dL   Nitrite NEGATIVE NEGATIVE   Leukocytes,Ua NEGATIVE NEGATIVE   RBC / HPF 0-5 0 - 5 RBC/hpf   WBC, UA 0-5 0 - 5 WBC/hpf   Bacteria, UA MANY (A) NONE SEEN   Squamous Epithelial / LPF 6-10 0 - 5  Mucus PRESENT   I-Stat beta hCG blood, ED  Result Value Ref Range   I-stat hCG, quantitative <5.0 <5 mIU/mL   Comment 3           Ct Renal Stone Study Result Date: 05/23/2018 CLINICAL DATA:  Left flank pain beginning yesterday. EXAM: CT ABDOMEN AND PELVIS WITHOUT CONTRAST TECHNIQUE: Multidetector CT imaging of the abdomen and pelvis was performed following the standard protocol without IV contrast. COMPARISON:  09/03/2008 FINDINGS: Lower chest: Lung bases are normal. Hepatobiliary: Previous cholecystectomy. Liver and biliary tree are normal. Pancreas: Normal. Spleen: Normal. Adrenals/Urinary Tract: Adrenal glands are normal. Kidneys normal in size without hydronephrosis or nephrolithiasis. Ureters and bladder are normal. Stomach/Bowel: Stomach and small bowel are normal. Appendix is normal.: Is normal. Vascular/Lymphatic: Normal. Reproductive: Uterus is midline with 1.4 cm fluid density over the left cornua. Right ovaries normal. 4 cm left ovarian cyst. Other: Tiny amount of free pelvic fluid likely physiologic. No focal inflammatory change. Musculoskeletal: Unremarkable. IMPRESSION: No acute findings in the abdomen/pelvis. No evidence of nephroureterolithiasis or hydronephrosis. 4 cm left ovarian cyst. 1.4 cm nonspecific fluid density over the left uterine cornua. Electronically Signed   By:  Marin Olp M.D.   On: 05/23/2018 10:12    US Pelvis Complete Result Date: 05/23/2018 CLINICAL DATA:  Flank pain.  CT shows ovarian cyst. EXAM: TRANSABDOMINAL AND TRANSVAGINAL ULTRASOUND OF PELVIS DOPPLER ULTRASOUND OF OVARIES TECHNIQUE: Both transabdominal and transvaginal ultrasound examinations of the pelvis were performed. Transabdominal technique was performed for global imaging of the pelvis including uterus, ovaries, adnexal regions, and pelvic cul-de-sac. It was necessary to proceed with endovaginal exam following the transabdominal exam to visualize the uterus, endometrium, adnexal regions. Color and duplex Doppler ultrasound was utilized to evaluate blood flow to the ovaries. COMPARISON:  CT of the abdomen and pelvis on 05/23/2018. FINDINGS: Uterus Measurements: Anteverted, 7.4 x 3.7 x 4.3 centimeters = volume: 61.1 mL. No fibroid. There is fluid collection in the LEFT cornual region measuring 1.2 centimeters and depth. This may represent entrapped fluid from prior uterine ablation or synechiae. Endometrium Thickness: 7.1.  No focal abnormality visualized. Right ovary Measurements: 2.7 x 2.4 x 1.8 centimeters = volume: 6.1 mL. Simple cyst is 2.1 x 1.5 x 1.1 centimeters Left ovary Measurements: 2.1 x 2.2 x 1.4 centimeters = volume: 3.2 mL. Normal appearance of the LEFT ovary. Within the LEFT adnexal region there is a tubular anechoic structure consistent with hydrosalpinx. Pulsed Doppler evaluation of both ovaries demonstrates normal low-resistance arterial and venous waveforms. Other findings No abnormal free fluid. IMPRESSION: Focal fluid collection within the LEFT cornual region, possibly representing entrapped endometrial fluid from prior ablation or synechiae. 2.1 centimeters simple RIGHT ovarian cyst. LEFT hydrosalpinx.  LEFT ovary is normal. Electronically Signed   By: Nolon Nations M.D.   On: 05/23/2018 12:30     1305:  Incidental findings on [place] as above. Denies vaginal  bleeding/discharge. Possible UTI on Udip; will tx with keflex. Feels better after meds. Pt has climbed off her stretcher, gotten herself dressed and wants to go home now. Workup otherwise reassuring. Tx symptomatically at this time. T/C returned from [place] for Artelia Laroche, case discussed, including:  HPI, pertinent PM/SHx, VS/PE, dx testing, ED course and treatment:  Agrees regarding US findings, no acute intervention at this time, requests to have pt f/u in office.  Dx and testing, as well as d/w OB/GYN office, d/w pt and family.  Questions answered.  Verb understanding, agreeable to d/c home with  outpt f/u.      Final Clinical Impressions(s) / ED Diagnoses   Final diagnoses:  None    ED Discharge Orders    None       Francine Graven, DO 05/28/18 1739

## 2018-05-23 NOTE — ED Notes (Signed)
Patient transported to CT 

## 2018-05-24 ENCOUNTER — Other Ambulatory Visit: Payer: Self-pay | Admitting: Certified Nurse Midwife

## 2018-12-22 HISTORY — PX: OOPHORECTOMY: SHX86

## 2018-12-27 ENCOUNTER — Other Ambulatory Visit: Payer: Self-pay | Admitting: Obstetrics and Gynecology

## 2019-01-10 ENCOUNTER — Other Ambulatory Visit (HOSPITAL_COMMUNITY)
Admission: RE | Admit: 2019-01-10 | Discharge: 2019-01-10 | Disposition: A | Discharge status: Home / Self Care | Payer: 59 | Source: Ambulatory Visit | Attending: Obstetrics and Gynecology | Admitting: Obstetrics and Gynecology

## 2019-01-10 DIAGNOSIS — Z01812 Encounter for preprocedural laboratory examination: Secondary | ICD-10-CM | POA: Insufficient documentation

## 2019-01-10 DIAGNOSIS — Z20828 Contact with and (suspected) exposure to other viral communicable diseases: Secondary | ICD-10-CM | POA: Diagnosis not present

## 2019-01-11 LAB — NOVEL CORONAVIRUS, NAA (HOSP ORDER, SEND-OUT TO REF LAB; TAT 18-24 HRS): SARS-CoV-2, NAA: NOT DETECTED

## 2019-01-12 ENCOUNTER — Other Ambulatory Visit: Payer: Self-pay

## 2019-01-12 ENCOUNTER — Encounter (HOSPITAL_BASED_OUTPATIENT_CLINIC_OR_DEPARTMENT_OTHER): Payer: Self-pay | Admitting: *Deleted

## 2019-01-12 NOTE — H&P (Signed)
Wendy Hicks is an 38 y.o. female. History of bilateral hydrosalpinx and ? Ovarian cyst for surgical intervention.   Pertinent Gynecological History: Menses: flow is light Bleeding: dysfunctional uterine bleeding Contraception: tubal ligation DES exposure: denies Blood transfusions: na Sexually transmitted diseases: no past history Previous GYN Procedures: cryo  Last mammogram: normal Date: 2020 Last pap: normal Date: 2020 OB History: G2, P2   Menstrual History: Menarche age: 72 No LMP recorded. Patient has had an ablation.    Past Medical History:  Diagnosis Date  . History of endometrial ablation    per pt done in GYN office in 2012  . Hydrosalpinx   . Left lower quadrant pain   . Seasonal allergies     Past Surgical History:  Procedure Laterality Date  . CESAREAN SECTION  04-15-2007   dr Ronita Hipps  @[facility]   . CESAREAN SECTION W/BTL  04-02-2009   dr Ronita Hipps @[facility]   . KNEE ARTHROSCOPY W/ ACL RECONSTRUCTION Left 2015  . LAPAROSCOPIC CHOLECYSTECTOMY  06-03-2007  @[facility]     History reviewed. No pertinent family history.  Social History:  reports that she has been smoking cigarettes. She has a 7.50 pack-year smoking history. She has never used smokeless tobacco. She reports that she does not drink alcohol or use drugs.  Allergies:  Allergies  Allergen Reactions  . Dilaudid [Hydromorphone Hcl] Hives  . Adhesive [Tape] Rash    "paper tape ok to use"    No medications prior to admission.    Review of Systems  Constitutional: Negative.   All other systems reviewed and are negative.   Height 5\' 7"  (1.702 m), weight 90.7 kg. Physical Exam  Nursing note and vitals reviewed. Constitutional: She is oriented to person, place, and time. She appears well-developed and well-nourished.  HENT:  Head: Normocephalic and atraumatic.  Neck: Normal range of motion. Neck supple.  Cardiovascular: Normal rate and regular rhythm.  Respiratory: Effort normal and breath sounds normal.  GI:  Soft. Bowel sounds are normal.  Genitourinary:    Vagina and uterus normal.   Musculoskeletal: Normal range of motion.  Neurological: She is alert and oriented to person, place, and time. She has normal reflexes.  Skin: Skin is warm and dry.  Psychiatric: She has a normal mood and affect.    No results found for this or any previous visit (from the past 24 hour(s)).  No results found.  Assessment/Plan: Bilateral hydrosalpinx davinci assisted bilateral salpingectomy, poss ov cystectomy Surgical risks discussed. Consent done.  Juancarlos Crescenzo J 01/12/2019, 8:58 PM

## 2019-01-12 NOTE — Progress Notes (Signed)
Spoke w/ via phone for pre-op interview--- PT Lab needs dos----  CBC COVID test ------ 01-10-2019 Arrive at ------- 0600 NPO after ------ MN Medications to take morning of surgery ----- Allegra Diabetic medication ----- n/a Patient Special Instructions ----- n/a Pre-Op special Istructions ----- n/a Patient verbalized understanding of instructions that were given at this phone interview. Patient denies shortness of breath, chest pain, fever, cough a this phone interview.

## 2019-01-12 NOTE — Anesthesia Preprocedure Evaluation (Addendum)
Anesthesia Evaluation  Patient identified by MRN, date of birth, ID band Patient awake    Reviewed: Allergy & Precautions, NPO status , Patient's Chart, lab work & pertinent test results  Airway Mallampati: II  TM Distance: >3 FB Neck ROM: Full    Dental no notable dental hx. (+) Dental Advisory Given, Teeth Intact   Pulmonary Current Smoker and Patient abstained from smoking.,    Pulmonary exam normal breath sounds clear to auscultation       Cardiovascular negative cardio ROS Normal cardiovascular exam Rhythm:Regular Rate:Normal     Neuro/Psych negative neurological ROS  negative psych ROS   GI/Hepatic negative GI ROS, Neg liver ROS,   Endo/Other  negative endocrine ROS  Renal/GU negative Renal ROS     Musculoskeletal negative musculoskeletal ROS (+)   Abdominal (+) + obese,   Peds  Hematology negative hematology ROS (+)   Anesthesia Other Findings   Reproductive/Obstetrics negative OB ROS                            Anesthesia Physical Anesthesia Plan  ASA: II  Anesthesia Plan: General   Post-op Pain Management:    Induction: Intravenous  PONV Risk Score and Plan: 4 or greater and Ondansetron, Dexamethasone, Midazolam, Scopolamine patch - Pre-op and Treatment may vary due to age or medical condition  Airway Management Planned: Oral ETT  Additional Equipment: None  Intra-op Plan:   Post-operative Plan: Extubation in OR  Informed Consent: I have reviewed the patients History and Physical, chart, labs and discussed the procedure including the risks, benefits and alternatives for the proposed anesthesia with the patient or authorized representative who has indicated his/her understanding and acceptance.     Dental advisory given  Plan Discussed with: CRNA  Anesthesia Plan Comments:         Anesthesia Quick Evaluation

## 2019-01-13 ENCOUNTER — Ambulatory Visit (HOSPITAL_BASED_OUTPATIENT_CLINIC_OR_DEPARTMENT_OTHER)
Admission: RE | Admit: 2019-01-13 | Discharge: 2019-01-13 | Disposition: A | Discharge status: Home / Self Care | Payer: 59 | Attending: Obstetrics and Gynecology | Admitting: Obstetrics and Gynecology

## 2019-01-13 ENCOUNTER — Other Ambulatory Visit: Payer: Self-pay

## 2019-01-13 ENCOUNTER — Encounter (HOSPITAL_BASED_OUTPATIENT_CLINIC_OR_DEPARTMENT_OTHER): Payer: Self-pay | Admitting: Emergency Medicine

## 2019-01-13 ENCOUNTER — Ambulatory Visit (HOSPITAL_BASED_OUTPATIENT_CLINIC_OR_DEPARTMENT_OTHER): Payer: 59 | Admitting: Anesthesiology

## 2019-01-13 ENCOUNTER — Encounter (HOSPITAL_BASED_OUTPATIENT_CLINIC_OR_DEPARTMENT_OTHER)
Admission: RE | Disposition: A | Discharge status: Home / Self Care | Payer: Self-pay | Source: Home / Self Care | Attending: Obstetrics and Gynecology

## 2019-01-13 DIAGNOSIS — D259 Leiomyoma of uterus, unspecified: Secondary | ICD-10-CM | POA: Insufficient documentation

## 2019-01-13 DIAGNOSIS — N838 Other noninflammatory disorders of ovary, fallopian tube and broad ligament: Secondary | ICD-10-CM | POA: Insufficient documentation

## 2019-01-13 DIAGNOSIS — K660 Peritoneal adhesions (postprocedural) (postinfection): Secondary | ICD-10-CM | POA: Diagnosis not present

## 2019-01-13 DIAGNOSIS — N7011 Chronic salpingitis: Secondary | ICD-10-CM | POA: Insufficient documentation

## 2019-01-13 DIAGNOSIS — Z888 Allergy status to other drugs, medicaments and biological substances status: Secondary | ICD-10-CM | POA: Insufficient documentation

## 2019-01-13 DIAGNOSIS — Z885 Allergy status to narcotic agent status: Secondary | ICD-10-CM | POA: Insufficient documentation

## 2019-01-13 DIAGNOSIS — R1032 Left lower quadrant pain: Secondary | ICD-10-CM | POA: Diagnosis present

## 2019-01-13 DIAGNOSIS — F17210 Nicotine dependence, cigarettes, uncomplicated: Secondary | ICD-10-CM | POA: Diagnosis not present

## 2019-01-13 HISTORY — PX: XI ROBOTIC ASSISTED SALPINGECTOMY: SHX6824

## 2019-01-13 HISTORY — DX: Chronic salpingitis: N70.11

## 2019-01-13 HISTORY — DX: Other specified postprocedural states: Z98.890

## 2019-01-13 HISTORY — DX: Left lower quadrant pain: R10.32

## 2019-01-13 HISTORY — DX: Other seasonal allergic rhinitis: J30.2

## 2019-01-13 LAB — CBC
HCT: 43.9 % (ref 36.0–46.0)
Hemoglobin: 14.8 g/dL (ref 12.0–15.0)
MCH: 30.5 pg (ref 26.0–34.0)
MCHC: 33.7 g/dL (ref 30.0–36.0)
MCV: 90.5 fL (ref 80.0–100.0)
Platelets: 161 10*3/uL (ref 150–400)
RBC: 4.85 MIL/uL (ref 3.87–5.11)
RDW: 12.4 % (ref 11.5–15.5)
WBC: 4 10*3/uL (ref 4.0–10.5)
nRBC: 0 % (ref 0.0–0.2)

## 2019-01-13 SURGERY — SALPINGECTOMY, ROBOT-ASSISTED
Anesthesia: General | Site: Abdomen | Laterality: Bilateral

## 2019-01-13 MED ORDER — FENTANYL CITRATE (PF) 250 MCG/5ML IJ SOLN
INTRAMUSCULAR | Status: AC
  Filled 2019-01-13: qty 5

## 2019-01-13 MED ORDER — SODIUM CHLORIDE 0.9 % IV SOLN
INTRAVENOUS | Status: DC | PRN
  Administered 2019-01-13: 60 mL

## 2019-01-13 MED ORDER — DEXAMETHASONE SODIUM PHOSPHATE 10 MG/ML IJ SOLN
INTRAMUSCULAR | Status: AC
  Filled 2019-01-13: qty 1

## 2019-01-13 MED ORDER — PROPOFOL 10 MG/ML IV BOLUS
INTRAVENOUS | Status: DC | PRN
  Administered 2019-01-13: 200 mg via INTRAVENOUS

## 2019-01-13 MED ORDER — ROCURONIUM BROMIDE 10 MG/ML (PF) SYRINGE
PREFILLED_SYRINGE | INTRAVENOUS | Status: DC | PRN
  Administered 2019-01-13: 60 mg via INTRAVENOUS
  Administered 2019-01-13: 20 mg via INTRAVENOUS

## 2019-01-13 MED ORDER — CEFAZOLIN SODIUM-DEXTROSE 2-4 GM/100ML-% IV SOLN
2.0000 g | INTRAVENOUS | Status: AC
  Administered 2019-01-13: 2 g via INTRAVENOUS
  Filled 2019-01-13: qty 100

## 2019-01-13 MED ORDER — ONDANSETRON HCL 4 MG/2ML IJ SOLN
INTRAMUSCULAR | Status: AC
  Filled 2019-01-13: qty 2

## 2019-01-13 MED ORDER — OXYCODONE-ACETAMINOPHEN 5-325 MG PO TABS: 1.0000 | ORAL_TABLET | ORAL | 0 refills | Status: DC | PRN

## 2019-01-13 MED ORDER — KETOROLAC TROMETHAMINE 30 MG/ML IJ SOLN
INTRAMUSCULAR | Status: AC
  Filled 2019-01-13: qty 1

## 2019-01-13 MED ORDER — ROCURONIUM BROMIDE 10 MG/ML (PF) SYRINGE
PREFILLED_SYRINGE | INTRAVENOUS | Status: AC
  Filled 2019-01-13: qty 10

## 2019-01-13 MED ORDER — LIDOCAINE 2% (20 MG/ML) 5 ML SYRINGE
INTRAMUSCULAR | Status: DC | PRN
  Administered 2019-01-13: 60 mg via INTRAVENOUS

## 2019-01-13 MED ORDER — BUPIVACAINE HCL (PF) 0.25 % IJ SOLN
INTRAMUSCULAR | Status: DC | PRN
  Administered 2019-01-13: 30 mL

## 2019-01-13 MED ORDER — ONDANSETRON HCL 4 MG/2ML IJ SOLN
INTRAMUSCULAR | Status: DC | PRN
  Administered 2019-01-13: 4 mg via INTRAVENOUS

## 2019-01-13 MED ORDER — ACETAMINOPHEN 325 MG PO TABS
ORAL_TABLET | ORAL | Status: DC | PRN
  Administered 2019-01-13: 1000 mg via ORAL

## 2019-01-13 MED ORDER — FENTANYL CITRATE (PF) 100 MCG/2ML IJ SOLN
INTRAMUSCULAR | Status: DC | PRN
  Administered 2019-01-13 (×5): 50 ug via INTRAVENOUS

## 2019-01-13 MED ORDER — ACETAMINOPHEN 500 MG PO TABS
ORAL_TABLET | ORAL | Status: AC
  Filled 2019-01-13: qty 2

## 2019-01-13 MED ORDER — PROMETHAZINE HCL 25 MG/ML IJ SOLN
6.2500 mg | INTRAMUSCULAR | Status: DC | PRN
  Filled 2019-01-13: qty 1

## 2019-01-13 MED ORDER — SCOPOLAMINE 1 MG/3DAYS TD PT72
MEDICATED_PATCH | TRANSDERMAL | Status: AC
  Filled 2019-01-13: qty 1

## 2019-01-13 MED ORDER — LIDOCAINE 2% (20 MG/ML) 5 ML SYRINGE
INTRAMUSCULAR | Status: AC
  Filled 2019-01-13: qty 10

## 2019-01-13 MED ORDER — MEPERIDINE HCL 25 MG/ML IJ SOLN
6.2500 mg | INTRAMUSCULAR | Status: DC | PRN
  Filled 2019-01-13: qty 1

## 2019-01-13 MED ORDER — LIDOCAINE 2% (20 MG/ML) 5 ML SYRINGE
INTRAMUSCULAR | Status: DC | PRN
  Administered 2019-01-13: 1.5 mg/kg/h via INTRAVENOUS

## 2019-01-13 MED ORDER — PROPOFOL 10 MG/ML IV BOLUS
INTRAVENOUS | Status: AC
  Filled 2019-01-13: qty 40

## 2019-01-13 MED ORDER — DEXAMETHASONE SODIUM PHOSPHATE 10 MG/ML IJ SOLN
INTRAMUSCULAR | Status: DC | PRN
  Administered 2019-01-13: 10 mg via INTRAVENOUS

## 2019-01-13 MED ORDER — LACTATED RINGERS IV SOLN
INTRAVENOUS | Status: DC
  Administered 2019-01-13 (×2): via INTRAVENOUS
  Filled 2019-01-13: qty 1000

## 2019-01-13 MED ORDER — MIDAZOLAM HCL 2 MG/2ML IJ SOLN
INTRAMUSCULAR | Status: DC | PRN
  Administered 2019-01-13: 2 mg via INTRAVENOUS

## 2019-01-13 MED ORDER — FENTANYL CITRATE (PF) 100 MCG/2ML IJ SOLN
25.0000 ug | INTRAMUSCULAR | Status: DC | PRN
  Administered 2019-01-13: 25 ug via INTRAVENOUS
  Administered 2019-01-13: 50 ug via INTRAVENOUS
  Filled 2019-01-13: qty 1

## 2019-01-13 MED ORDER — MIDAZOLAM HCL 2 MG/2ML IJ SOLN
INTRAMUSCULAR | Status: AC
  Filled 2019-01-13: qty 2

## 2019-01-13 MED ORDER — SUGAMMADEX SODIUM 200 MG/2ML IV SOLN
INTRAVENOUS | Status: DC | PRN
  Administered 2019-01-13: 250 mg via INTRAVENOUS

## 2019-01-13 MED ORDER — ARTIFICIAL TEARS OPHTHALMIC OINT
TOPICAL_OINTMENT | OPHTHALMIC | Status: AC
  Filled 2019-01-13: qty 3.5

## 2019-01-13 MED ORDER — SODIUM CHLORIDE 0.9 % IR SOLN
Status: DC | PRN
  Administered 2019-01-13: 3000 mL

## 2019-01-13 MED ORDER — OXYCODONE HCL 5 MG PO TABS
5.0000 mg | ORAL_TABLET | Freq: Once | ORAL | Status: DC | PRN
  Filled 2019-01-13: qty 1

## 2019-01-13 MED ORDER — SCOPOLAMINE 1 MG/3DAYS TD PT72
MEDICATED_PATCH | TRANSDERMAL | Status: DC | PRN
  Administered 2019-01-13: 1 via TRANSDERMAL

## 2019-01-13 MED ORDER — FENTANYL CITRATE (PF) 100 MCG/2ML IJ SOLN
INTRAMUSCULAR | Status: AC
  Filled 2019-01-13: qty 2

## 2019-01-13 MED ORDER — LIDOCAINE 2% (20 MG/ML) 5 ML SYRINGE
INTRAMUSCULAR | Status: AC
  Filled 2019-01-13: qty 5

## 2019-01-13 MED ORDER — OXYCODONE HCL 5 MG/5ML PO SOLN
5.0000 mg | Freq: Once | ORAL | Status: DC | PRN
  Filled 2019-01-13: qty 5

## 2019-01-13 MED ORDER — CEFAZOLIN SODIUM-DEXTROSE 2-4 GM/100ML-% IV SOLN
INTRAVENOUS | Status: AC
  Filled 2019-01-13: qty 100

## 2019-01-13 SURGICAL SUPPLY — 56 items
BARRIER ADHS 3X4 INTERCEED (GAUZE/BANDAGES/DRESSINGS) ×1 IMPLANT
CANISTER SUCT 3000ML PPV (MISCELLANEOUS) ×1 IMPLANT
CATH FOLEY 3WAY  5CC 16FR (CATHETERS) ×1
CATH FOLEY 3WAY 5CC 16FR (CATHETERS) ×1 IMPLANT
COVER BACK TABLE 60X90IN (DRAPES) ×2 IMPLANT
COVER TIP SHEARS 8 DVNC (MISCELLANEOUS) ×1 IMPLANT
COVER TIP SHEARS 8MM DA VINCI (MISCELLANEOUS) ×1
COVER WAND RF STERILE (DRAPES) ×2 IMPLANT
DECANTER SPIKE VIAL GLASS SM (MISCELLANEOUS) ×4 IMPLANT
DEFOGGER SCOPE WARMER CLEARIFY (MISCELLANEOUS) ×2 IMPLANT
DERMABOND ADVANCED (GAUZE/BANDAGES/DRESSINGS) ×1
DERMABOND ADVANCED .7 DNX12 (GAUZE/BANDAGES/DRESSINGS) ×1 IMPLANT
DRAPE ARM DVNC X/XI (DISPOSABLE) ×4 IMPLANT
DRAPE COLUMN DVNC XI (DISPOSABLE) ×1 IMPLANT
DRAPE DA VINCI XI ARM (DISPOSABLE) ×4
DRAPE DA VINCI XI COLUMN (DISPOSABLE) ×1
DURAPREP 26ML APPLICATOR (WOUND CARE) ×2 IMPLANT
ELECT REM PT RETURN 9FT ADLT (ELECTROSURGICAL) ×4
ELECTRODE REM PT RTRN 9FT ADLT (ELECTROSURGICAL) ×1 IMPLANT
GAUZE 4X4 16PLY RFD (DISPOSABLE) ×2 IMPLANT
GLOVE BIO SURGEON STRL SZ7 (GLOVE) IMPLANT
GLOVE BIO SURGEON STRL SZ7.5 (GLOVE) ×6 IMPLANT
GLOVE BIOGEL PI IND STRL 7.0 (GLOVE) IMPLANT
GLOVE BIOGEL PI INDICATOR 7.0 (GLOVE)
IRRIG SUCT STRYKERFLOW 2 WTIP (MISCELLANEOUS) ×2
IRRIGATION SUCT STRKRFLW 2 WTP (MISCELLANEOUS) ×1 IMPLANT
NEEDLE INSUFFLATION 150MM (ENDOMECHANICALS) ×2 IMPLANT
OBTURATOR OPTICAL STANDARD 8MM (TROCAR) ×1
OBTURATOR OPTICAL STND 8 DVNC (TROCAR) ×1
OBTURATOR OPTICALSTD 8 DVNC (TROCAR) ×1 IMPLANT
OCCLUDER COLPOPNEUMO (BALLOONS) ×1 IMPLANT
PACK ROBOT WH (CUSTOM PROCEDURE TRAY) ×2 IMPLANT
PACK ROBOTIC GOWN (GOWN DISPOSABLE) ×2 IMPLANT
PACK TRENDGUARD 450 HYBRID PRO (MISCELLANEOUS) IMPLANT
PAD PREP 24X48 CUFFED NSTRL (MISCELLANEOUS) ×2 IMPLANT
PROTECTOR NERVE ULNAR (MISCELLANEOUS) ×3 IMPLANT
SEAL CANN UNIV 5-8 DVNC XI (MISCELLANEOUS) ×4 IMPLANT
SEAL XI 5MM-8MM UNIVERSAL (MISCELLANEOUS) ×4
SET CYSTO W/LG BORE CLAMP LF (SET/KITS/TRAYS/PACK) IMPLANT
SET TRI-LUMEN FLTR TB AIRSEAL (TUBING) ×2 IMPLANT
SPONGE LAP 4X18 RFD (DISPOSABLE) IMPLANT
SUT VIC AB 0 CT1 27 (SUTURE) ×1
SUT VIC AB 0 CT1 27XBRD ANBCTR (SUTURE) ×1 IMPLANT
SUT VICRYL 0 UR6 27IN ABS (SUTURE) ×2 IMPLANT
SUT VICRYL RAPIDE 4/0 PS 2 (SUTURE) ×4 IMPLANT
SUT VLOC 180 0 9IN  GS21 (SUTURE) ×1
SUT VLOC 180 0 9IN GS21 (SUTURE) ×1 IMPLANT
TIP RUMI ORANGE 6.7MMX12CM (TIP) IMPLANT
TIP UTERINE 5.1X6CM LAV DISP (MISCELLANEOUS) IMPLANT
TIP UTERINE 6.7X10CM GRN DISP (MISCELLANEOUS) IMPLANT
TIP UTERINE 6.7X6CM WHT DISP (MISCELLANEOUS) IMPLANT
TIP UTERINE 6.7X8CM BLUE DISP (MISCELLANEOUS) IMPLANT
TOWEL OR 17X26 10 PK STRL BLUE (TOWEL DISPOSABLE) ×2 IMPLANT
TRENDGUARD 450 HYBRID PRO PACK (MISCELLANEOUS) ×2
TROCAR PORT AIRSEAL 8X120 (TROCAR) ×2 IMPLANT
WATER STERILE IRR 1000ML POUR (IV SOLUTION) ×2 IMPLANT

## 2019-01-13 NOTE — Anesthesia Procedure Notes (Signed)
Procedure Name: Intubation Date/Time: 01/13/2019 8:08 AM Performed by: Mechele Claude, CRNA Pre-anesthesia Checklist: Patient identified, Emergency Drugs available, Suction available and Patient being monitored Patient Re-evaluated:Patient Re-evaluated prior to induction Oxygen Delivery Method: Circle system utilized Preoxygenation: Pre-oxygenation with 100% oxygen Induction Type: IV induction Ventilation: Mask ventilation without difficulty Laryngoscope Size: Mac and 3 Grade View: Grade I Tube type: Oral Tube size: 7.0 mm Number of attempts: 1 Airway Equipment and Method: Stylet and Oral airway Placement Confirmation: ETT inserted through vocal cords under direct vision,  positive ETCO2 and breath sounds checked- equal and bilateral Secured at: 21 cm Tube secured with: Tape Dental Injury: Teeth and Oropharynx as per pre-operative assessment

## 2019-01-13 NOTE — Discharge Instructions (Signed)
DISCHARGE INSTRUCTIONS: Laparoscopy  The following instructions have been prepared to help you care for yourself upon your return home today.  Wound care: . Do not get the incision wet for the first 24 hours. The incision should be kept clean and dry. . The Band-Aids or dressings may be removed the day after surgery. . Should the incision become sore, red, and swollen after the first week, check with your doctor.  Personal hygiene: . Shower the day after your procedure.  Activity and limitations: . Do NOT drive or operate any equipment today. . Do NOT lift anything more than 15 pounds for 2-3 weeks after surgery. . Do NOT rest in bed all day. . Walking is encouraged. Walk each day, starting slowly with 5-minute walks 3 or 4 times a day. Slowly increase the length of your walks. . Walk up and down stairs slowly. . Do NOT do strenuous activities, such as golfing, playing tennis, bowling, running, biking, weight lifting, gardening, mowing, or vacuuming for 2-4 weeks. Ask your doctor when it is okay to start.  Diet: Eat a light meal as desired this evening. You may resume your usual diet tomorrow.  Return to work: This is dependent on the type of work you do. For the most part you can return to a desk job within a week of surgery. If you are more active at work, please discuss this with your doctor.  What to expect after your surgery: You may have a slight burning sensation when you urinate on the first day. You may have a very small amount of blood in the urine. Expect to have a small amount of vaginal discharge/light bleeding for 1-2 weeks. It is not unusual to have abdominal soreness and bruising for up to 2 weeks. You may be tired and need more rest for about 1 week. You may experience shoulder pain for 24-72 hours. Lying flat in bed may relieve it.  Call your doctor for any of the following: . Develop a fever of 100.4 or greater . Inability to urinate 6 hours after discharge from  hospital . Severe pain not relieved by pain medications . Persistent of heavy bleeding at incision site . Redness or swelling around incision site after a week . Increasing nausea or vomiting   Post Anesthesia Home Care Instructions  Activity: Get plenty of rest for the remainder of the day. A responsible adult should stay with you for 24 hours following the procedure.  For the next 24 hours, DO NOT: -Drive a car -Operate machinery -Drink alcoholic beverages -Take any medication unless instructed by your physician -Make any legal decisions or sign important papers.  Meals: Start with liquid foods such as gelatin or soup. Progress to regular foods as tolerated. Avoid greasy, spicy, heavy foods. If nausea and/or vomiting occur, drink only clear liquids until the nausea and/or vomiting subsides. Call your physician if vomiting continues.  Special Instructions/Symptoms: Your throat may feel dry or sore from the anesthesia or the breathing tube placed in your throat during surgery. If this causes discomfort, gargle with warm salt water. The discomfort should disappear within 24 hours.  If you had a scopolamine patch placed behind your ear for the management of post- operative nausea and/or vomiting:  1. The medication in the patch is effective for 72 hours, after which it should be removed.  Wrap patch in a tissue and discard in the trash. Wash hands thoroughly with soap and water. 2. You may remove the patch earlier than 72   hours if you experience unpleasant side effects which may include dry mouth, dizziness or visual disturbances. 3. Avoid touching the patch. Wash your hands with soap and water after contact with the patch.    

## 2019-01-13 NOTE — Op Note (Signed)
01/13/2019  9:36 AM  PATIENT:  Wendy Hicks  38 y.o. female  PRE-OPERATIVE DIAGNOSIS:  Left Lower Quadrant Pain, Hydrosalpinx  POST-OPERATIVE DIAGNOSIS:  Left Lower Quadrant Pain, Hydrosalpinx  PROCEDURE:  Procedure(s): XI ROBOTIC ASSISTED RIGHT salpingectomy Lysis of omental adhesions Lysis of cul de sac adhesions to bowel LSO  SURGEON:  Ronita Hipps, MD  ASSISTANTSBenjie Karvonen, MD   ANESTHESIA:   local and general  ESTIMATED BLOOD LOSS: minimal  DRAINS: Urinary Catheter (Foley)   LOCAL MEDICATIONS USED:  MARCAINE    and Amount: 20 ml  SPECIMEN:  Source of Specimen:  RSO and left tube  DISPOSITION OF SPECIMEN:  PATHOLOGY  COUNTS:  YES  DICTATION #[id number]  PLAN OF CARE: DC home  PATIENT DISPOSITION:  PACU - hemodynamically stable.

## 2019-01-13 NOTE — Progress Notes (Signed)
Patient seen and examined. Consent witnessed and signed. No changes noted. Update completed. BP 112/69   Pulse 92   Temp 98.6 F (37 C) (Oral)   Resp 14   Ht 5\' 7"  (1.702 m)   Wt 110.5 kg   SpO2 98%   BMI 38.14 kg/m   CBC    Component Value Date/Time   WBC 4.0 01/13/2019 0643   RBC 4.85 01/13/2019 0643   HGB 14.8 01/13/2019 0643   HCT 43.9 01/13/2019 0643   PLT 161 01/13/2019 0643   MCV 90.5 01/13/2019 0643   MCH 30.5 01/13/2019 0643   MCHC 33.7 01/13/2019 0643   RDW 12.4 01/13/2019 0643   LYMPHSABS 1.5 05/23/2018 0824   MONOABS 0.4 05/23/2018 0824   EOSABS 0.1 05/23/2018 0824   BASOSABS 0.1 05/23/2018 0824

## 2019-01-13 NOTE — Anesthesia Postprocedure Evaluation (Signed)
Anesthesia Post Note  Patient: Wendy Hicks  Procedure(s) Performed: XI ROBOTIC ASSISTED Right  SALPINGECTOMY,  Left Salpingo oophrectomy, Lysis of Omental and Cul De Sac  Adhesions (Bilateral Abdomen)     Patient location during evaluation: PACU Anesthesia Type: General Level of consciousness: sedated and patient cooperative Pain management: pain level controlled Vital Signs Assessment: post-procedure vital signs reviewed and stable Respiratory status: spontaneous breathing Cardiovascular status: stable Anesthetic complications: no    Last Vitals:  Vitals:   01/13/19 1015 01/13/19 1116  BP: 128/83 122/78  Pulse: (!) 58 70  Resp: 14 18  Temp:    SpO2: 100% 100%    Last Pain:  Vitals:   01/13/19 1015  TempSrc:   PainSc: [place]

## 2019-01-13 NOTE — Transfer of Care (Signed)
   Last Vitals:  Vitals Value Taken Time  BP 131/69 01/13/19 0952  Temp 36.4 C 01/13/19 0952  Pulse 70 01/13/19 0952  Resp 8 01/13/19 0952  SpO2 100 % 01/13/19 0952  Vitals shown include unvalidated device data.  Last Pain:  Vitals:   01/13/19 0651  TempSrc: Oral      Patients Stated Pain Goal: 5 (01/13/19 [id number]) Immediate Anesthesia Transfer of Care Note  Patient: Wendy Hicks  Procedure(s) Performed: Procedure(s) (LRB): XI ROBOTIC ASSISTED SALPINGECTOMY/Possible Left Ovarian Cystectomy (Bilateral)  Patient Location: PACU  Anesthesia Type: General  Level of Consciousness: awake, alert  and oriented  Airway & Oxygen Therapy: Patient Spontanous Breathing and Patient connected to nasal cannula oxygen  Post-op Assessment: Report given to PACU RN and Post -op Vital signs reviewed and stable  Post vital signs: Reviewed and stable  Complications: No apparent anesthesia complications

## 2019-01-16 ENCOUNTER — Encounter (HOSPITAL_BASED_OUTPATIENT_CLINIC_OR_DEPARTMENT_OTHER): Payer: Self-pay | Admitting: Obstetrics and Gynecology

## 2019-01-16 NOTE — Op Note (Signed)
NAME: Wendy Hicks, Wendy Hicks MEDICAL RECORD [id number] ACCOUNT [id number] DATE OF BIRTH:[birth year] FACILITY: [facility] LOCATION: [facility] PHYSICIAN:Elias Dennington J. Deaven Barron, MD  OPERATIVE REPORT  DATE OF PROCEDURE:  01/13/2019  CHIEF COMPLAINT:  Left lower quadrant pain, questionable left lower quadrant mass by ultrasound.  POSTOPERATIVE DIAGNOSES:  Left hydrosalpinx, left tubo-ovarian abscess, right adnexal adhesions, omental adhesions to the anterior abdominal wall, sigmoid adhesions to left adnexa.  PROCEDURE:  Da Vinci-assisted right salpingectomy, lysis of omental adhesions to the anterior abdominal wall, lysis of cul-de-sac adhesions to bowel, left adnexa, left salpingo-oophorectomy.  SURGEON:  Brien Few, MD  ASSISTANTManon Hilding, MD  ANESTHESIA:  Local, general.  ESTIMATED BLOOD LOSS:  Less than 50 mL.  COMPLICATIONS:  None.  DRAINS:  None.  COUNTS:  Correct.  DISPOSITION:  The patient was taken to recovery in good condition.    BRIEF OPERATIVE NOTE:  After being apprised of the risks of anesthesia, infection, bleeding, injury to surrounding organs, possible need for repair, delayed versus immediate complications including bowel and bladder injury, and possible need for repair  the patient was brought to the operating room.  She was administered general anesthetic without complications.  Prepped and draped in usual sterile fashion, Foley catheter placed.  Exam under anesthesia reveals an anteflexed uterus and no adnexal masses.   A single tooth tenaculum placed vaginally.  Unable to enter an intrauterine manipulator due to history of previous endometrial ablation.  At this time, attention turned to the abdominal portion of the procedure whereby an infraumbilical incision was  made with a scalpel.  Veress needle placed, opening pressure -2, 4 liters of CO2 insufflated without difficulty.  Trocar placed atraumatically.  Trocar sites are placed, 2 left lateral to the umbilical port,  and 1 right.  A large drape of omental  adhesions to the anterior abdominal wall just distal to the umbilical port is noted.  Camera was placed in the lateral port and EndoShears are entered and omental adhesions were lysed from the anterior abdominal wall without difficulty.  At this time,  EndoShears and bipolar forceps were entered.  The pelvis was approached.  The uterus with minimal manipulation due to inability to manipulate, but is pushed anteriorly by the vaginal assistant.  At this time, the adhesions were sharply adhesed from the  sigmoid colon to the left adnexa exposing a poorly defined left tubo-ovarian complex with inability to differentiate normal ovaries and dilated tube, which was adhesed into the left cul-de-sac and left pelvic fossa.  Retroperitoneal space was entered.  Ureter was  identified and this mass at infundibulopelvic ligament was cauterized and cut with the decision to remove this complex due to inability to note normal ovary.  The tubo-ovarian ligament was also lysed and further dissection into the retroperitoneal space  was able to cleanly remove this mass from the pelvic sidewall and the cul-de-sac.  Ureter in vision during the entire process.  The mass was then removed from the cul-de-sac and placed anteriorly.  There were also adhesions of the right adnexa into the  cul-de-sac.  There was a right follicular ovarian cyst and a previously divided right tube, which was then removed excising along the mesosalpinx using electrocautery and then also at the small stump of the uterotubal junction.  Minimal bleeding noted  at time.  All adhesiolysis was then performed.  Interceed is entered and placed into the left cul-de-sac and the previous area of dissection.  Ureters peristalsing normally, left and right.  Urine is clear.  At  this time, all specimens were extracted  through the assistant port without difficulty.  Good hemostasis was noted.  CO2 was released.  All instruments were  removed under direct visualization.  Incisions closed using 4-0 Vicryl and Dermabond.  Instruments removed from the vagina.  The patient  tolerated the procedure well, was awakened and transferred to recovery in good condition.  CN/NUANCE  D:01/16/2019 T:01/16/2019 JOB:[id number]

## 2019-01-18 LAB — SURGICAL PATHOLOGY

## 2019-12-31 IMAGING — CT CT RENAL STONE PROTOCOL
2 of 4 series · 17 of 46 positions shown, 19 images · non-contrast
Comparison: 09/03/2008

CLINICAL DATA: Left flank pain beginning yesterday.

EXAM:
CT ABDOMEN AND PELVIS WITHOUT CONTRAST
TECHNIQUE: Multidetector CT imaging of the abdomen and pelvis was performed
following the standard protocol without IV contrast.

[Series 2: axial st · axial · 0.98mm/px · z∈[+838,+1312]mm · 14 of 107 slices shown, 16 images]
[im 6/107  soft-tissue]
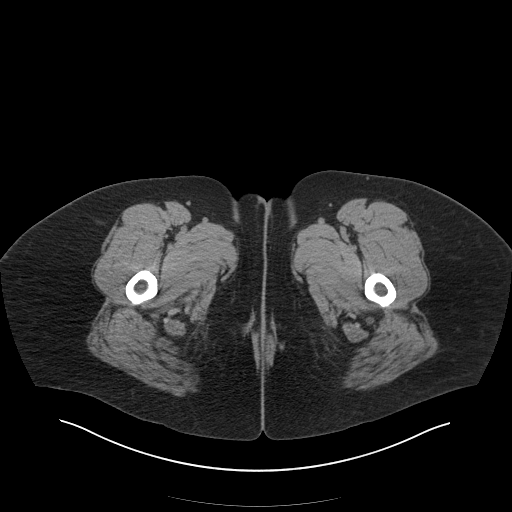
[im 6/107  bone]
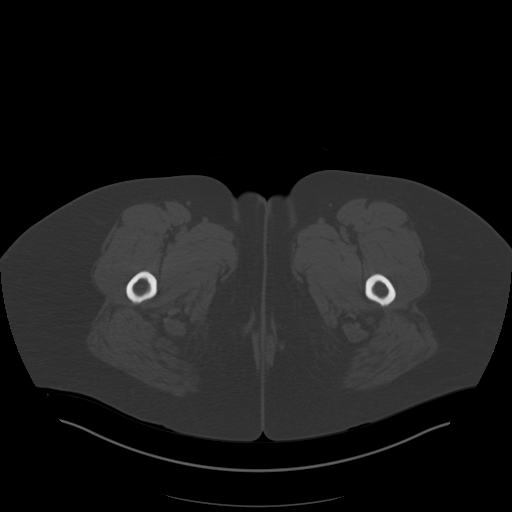
[im 16/107  soft-tissue]
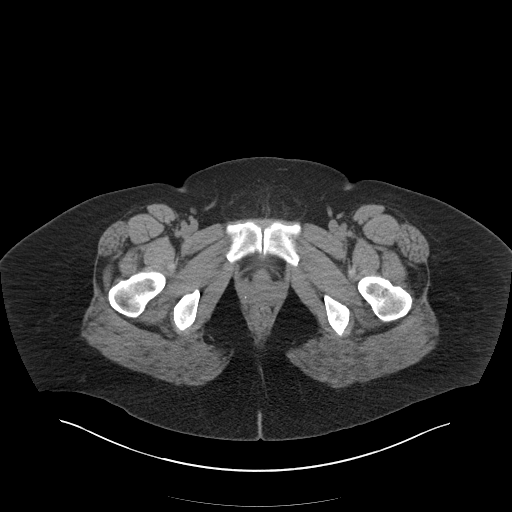
[im 22/107  soft-tissue]
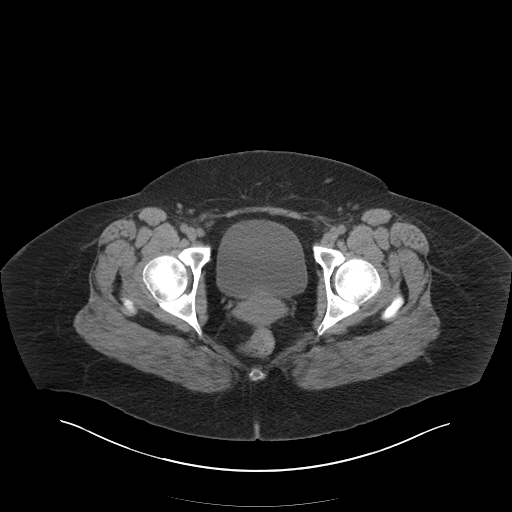
[im 27/107  soft-tissue]
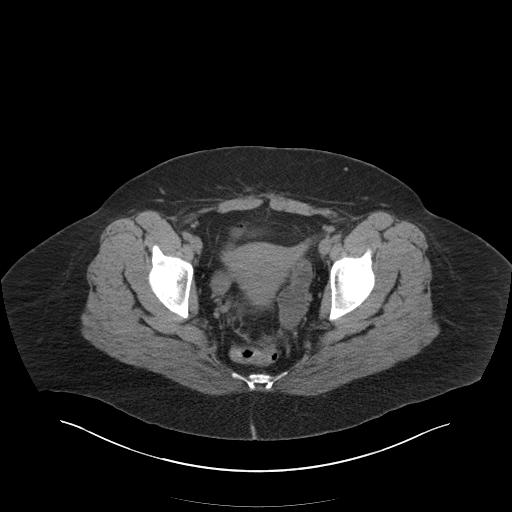
[im 38/107  soft-tissue]
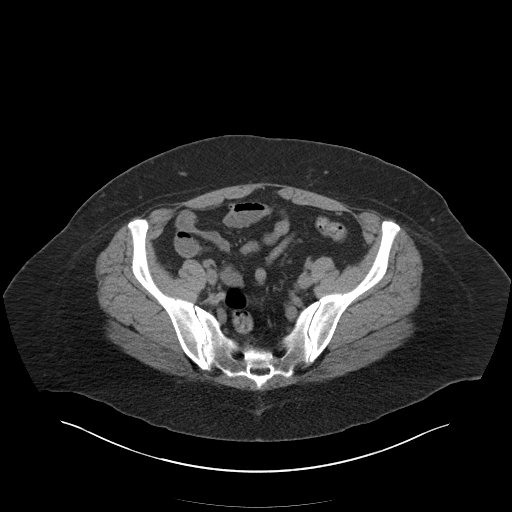
[im 43/107  soft-tissue]
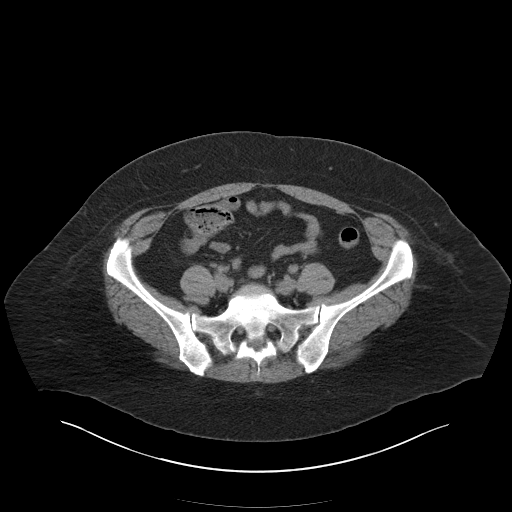
[im 48/107  soft-tissue]
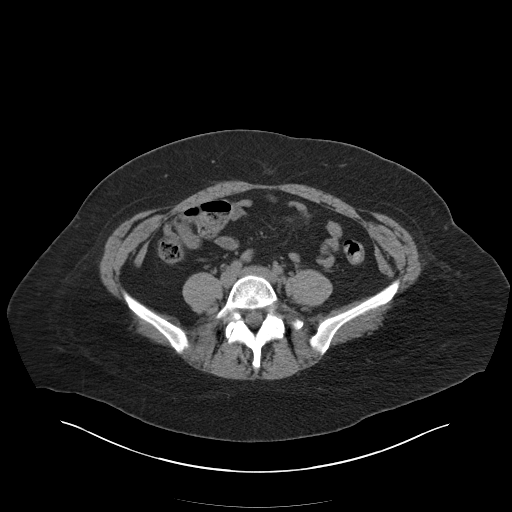
[im 59/107  soft-tissue]
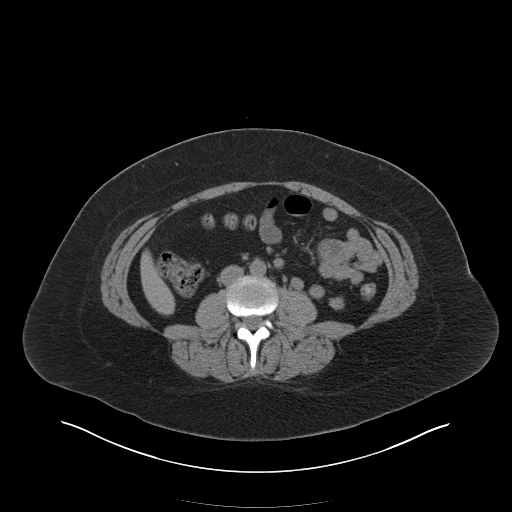
[im 64/107  soft-tissue]
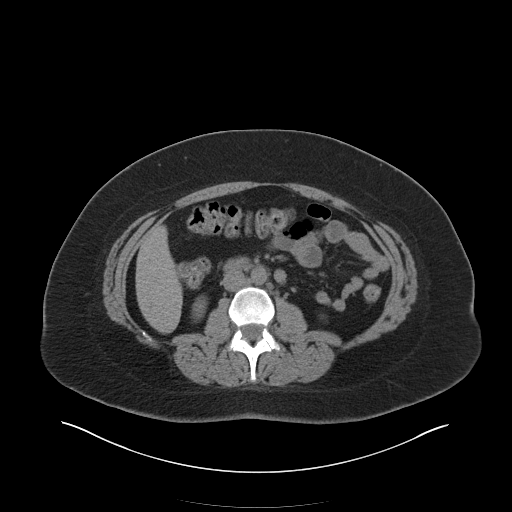
[im 64/107  bone]
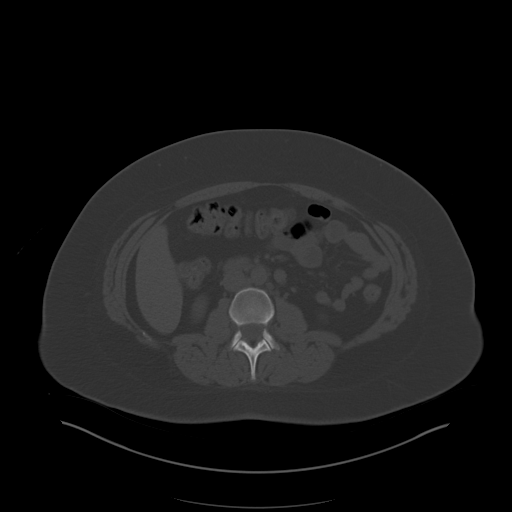
[im 69/107  soft-tissue]
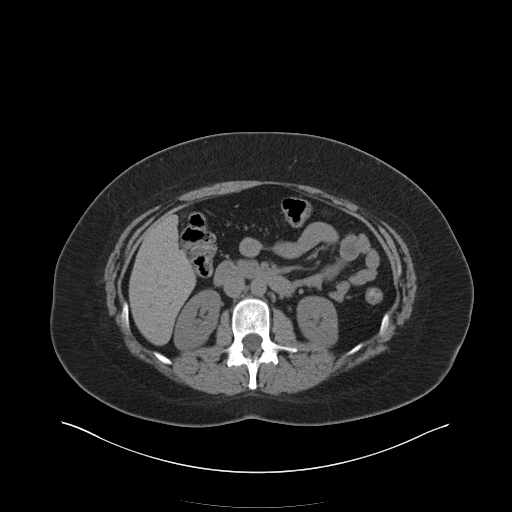
[im 80/107  soft-tissue]
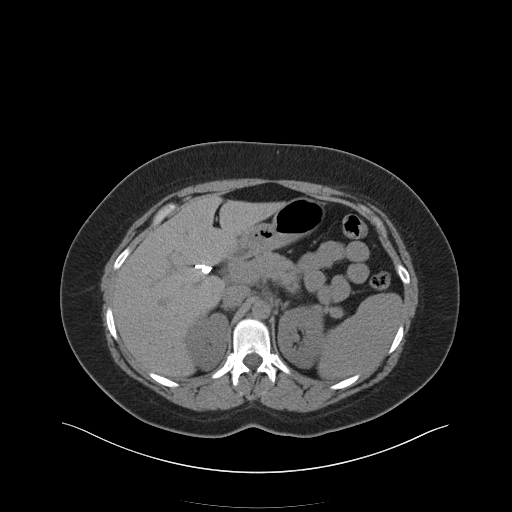
[im 85/107  soft-tissue]
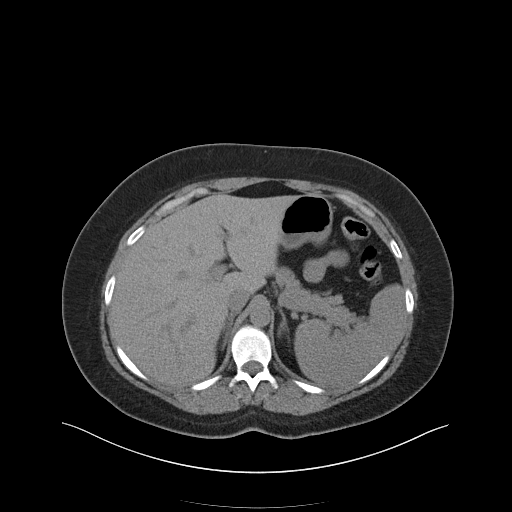
[im 91/107  soft-tissue]
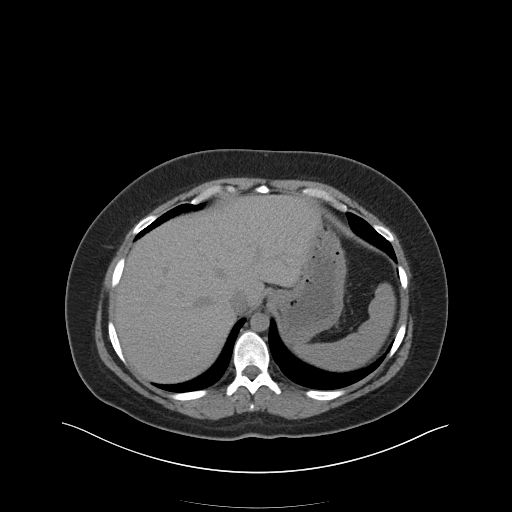
[im 101/107  soft-tissue]
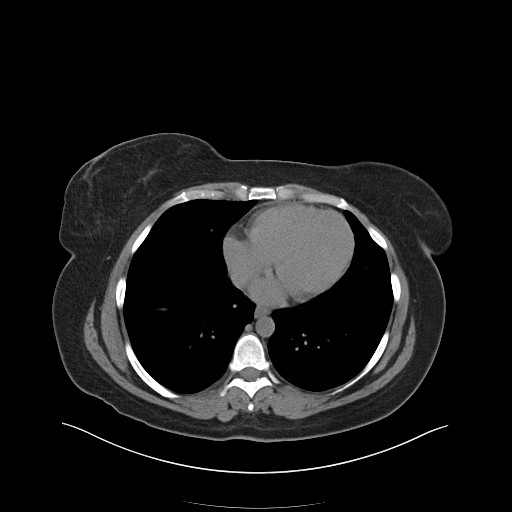

[Series 4: coronal · coronal · 0.89mm/px · 3 of 163 slices shown]
[im 55/163  soft-tissue]
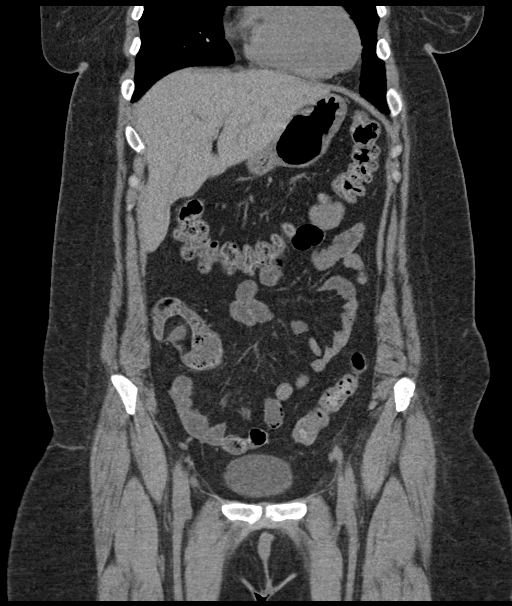
[im 73/163  soft-tissue]
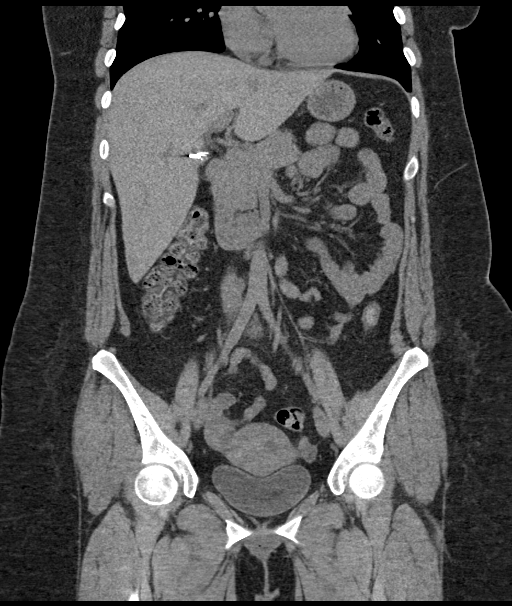
[im 91/163  soft-tissue]
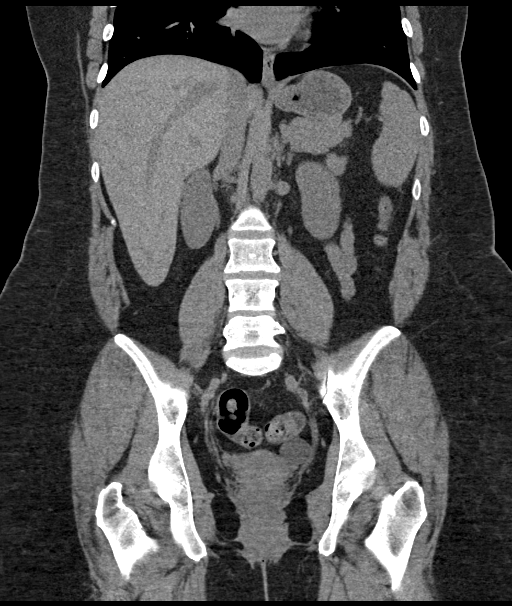

[17 of 46 positions shown; findings below may reference images not displayed]

FINDINGS: Lower chest: Lung bases are normal.

Hepatobiliary: Previous cholecystectomy. Liver and biliary tree are
normal.

Pancreas: Normal.

Spleen: Normal.

Adrenals/Urinary Tract: Adrenal glands are normal. Kidneys normal in
size without hydronephrosis or nephrolithiasis. Ureters and bladder
are normal.

Stomach/Bowel: Stomach and small bowel are normal. Appendix is
normal.: Is normal.

Vascular/Lymphatic: Normal.

Reproductive: Uterus is midline with 1.4 cm fluid density over the
left cornua. Right ovaries normal. 4 cm left ovarian cyst.

Other: Tiny amount of free pelvic fluid likely physiologic. No focal
inflammatory change.

Musculoskeletal: Unremarkable.
IMPRESSION: No acute findings in the abdomen/pelvis. No evidence of
nephroureterolithiasis or hydronephrosis.

4 cm left ovarian cyst. 1.4 cm nonspecific fluid density over the
left uterine cornua.

## 2019-12-31 IMAGING — US US PELVIS COMPLETE
1 series · 13 of 25 positions shown · non-contrast
Comparison: CT of the abdomen and pelvis on 05/23/2018.

CLINICAL DATA: Flank pain.  CT shows ovarian cyst.

EXAM:
TRANSABDOMINAL AND TRANSVAGINAL ULTRASOUND OF PELVIS
DOPPLER ULTRASOUND OF OVARIES
TECHNIQUE: Both transabdominal and transvaginal ultrasound examinations of the
pelvis were performed. Transabdominal technique was performed for
global imaging of the pelvis including uterus, ovaries, adnexal
regions, and pelvic cul-de-sac.
It was necessary to proceed with endovaginal exam following the
transabdominal exam to visualize the uterus, endometrium, adnexal
regions. Color and duplex Doppler ultrasound was utilized to
evaluate blood flow to the ovaries.

[Series 1: us pelvis complete · 46 acquisitions, 13 frames shown]
[im 1/46]
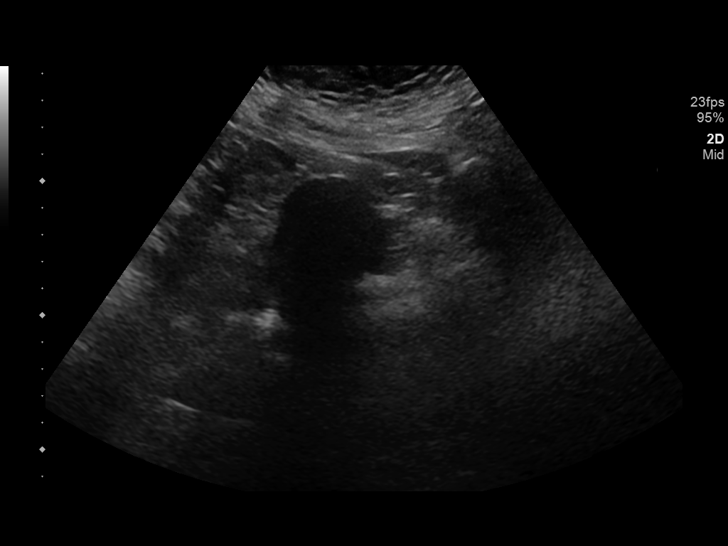
[im 4/46]
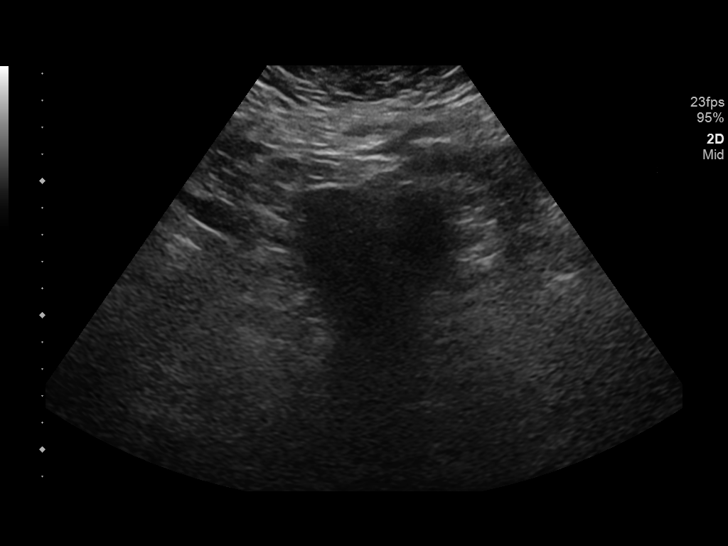
[im 8/46]
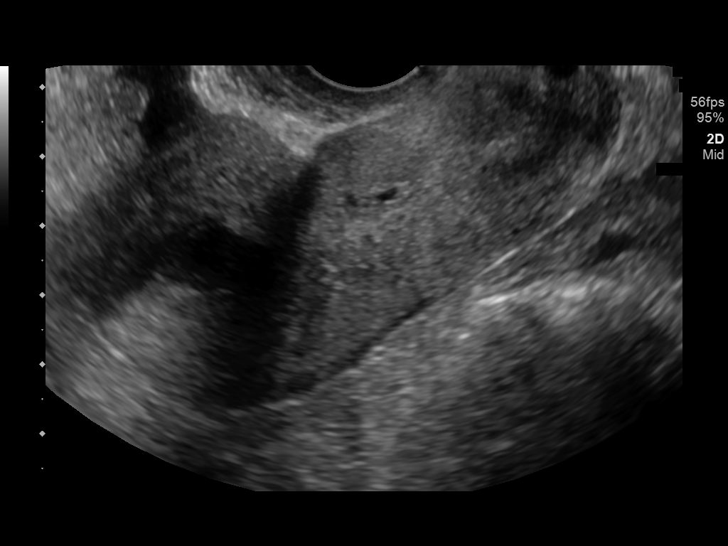
[im 12/46]
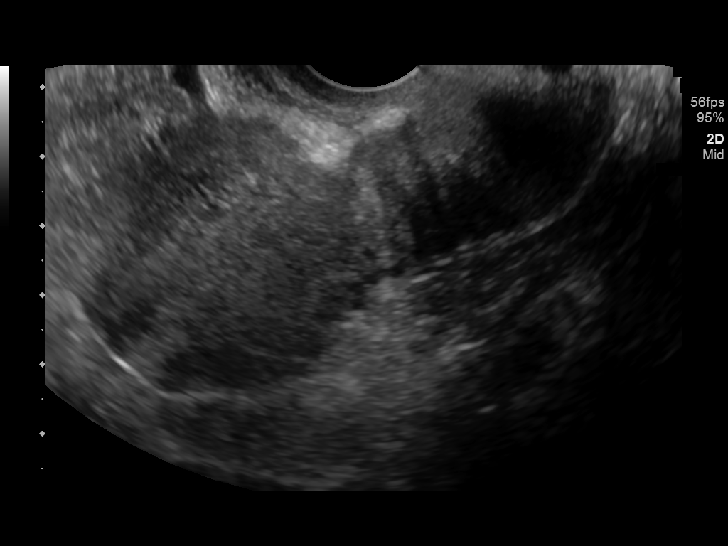
[im 16/46]
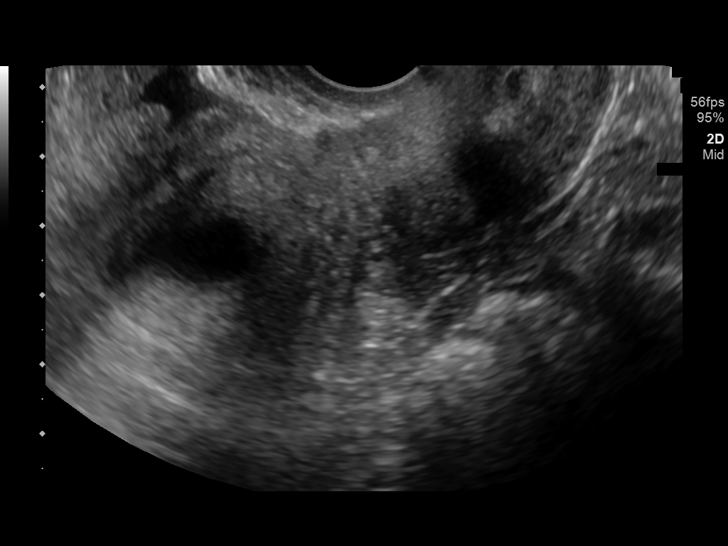
[im 19/46]
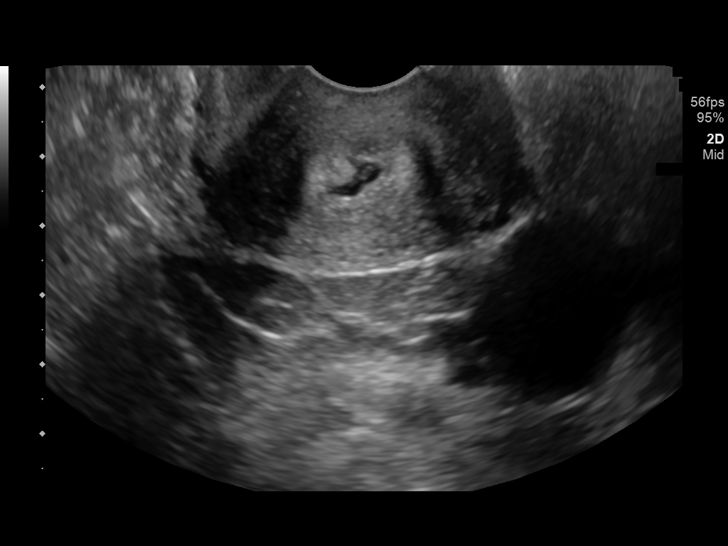
[im 23/46]
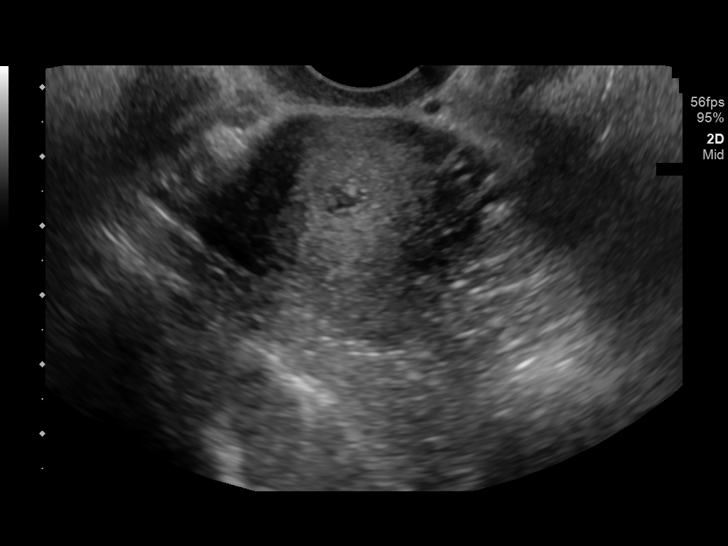
[im 27/46]
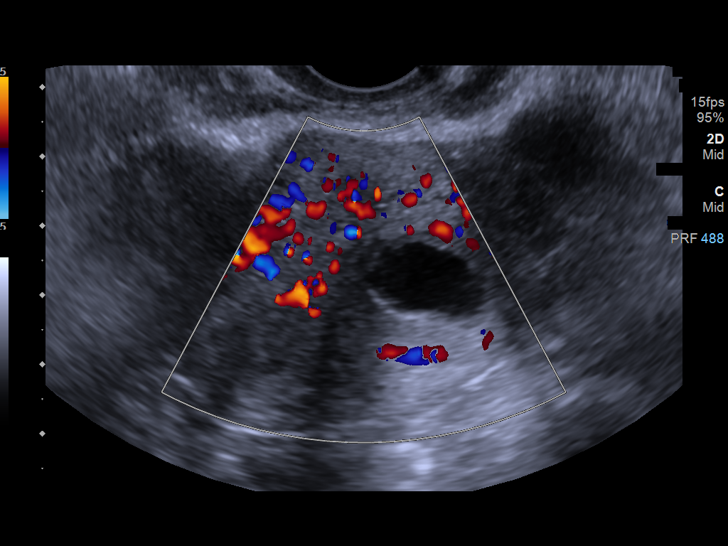
[im 31/46]
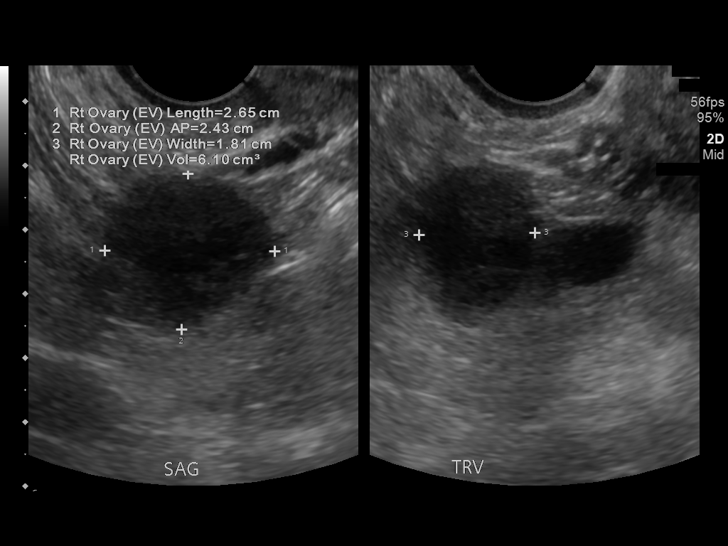
[im 34/46]
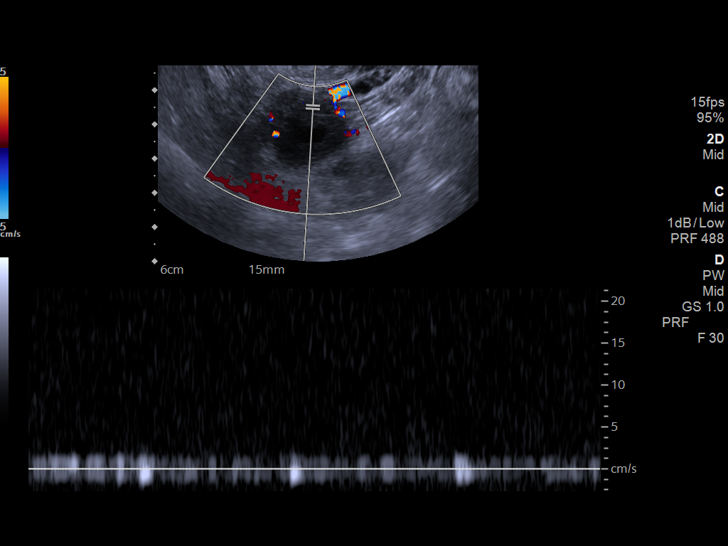
[im 38/46]
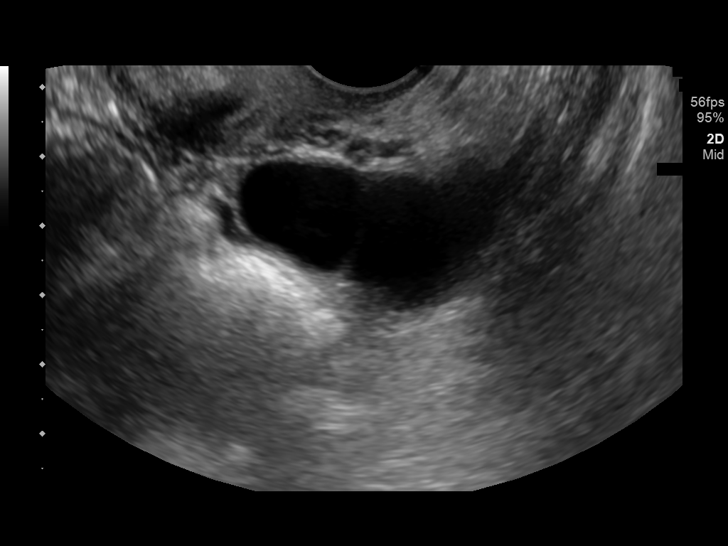
[im 42/46]
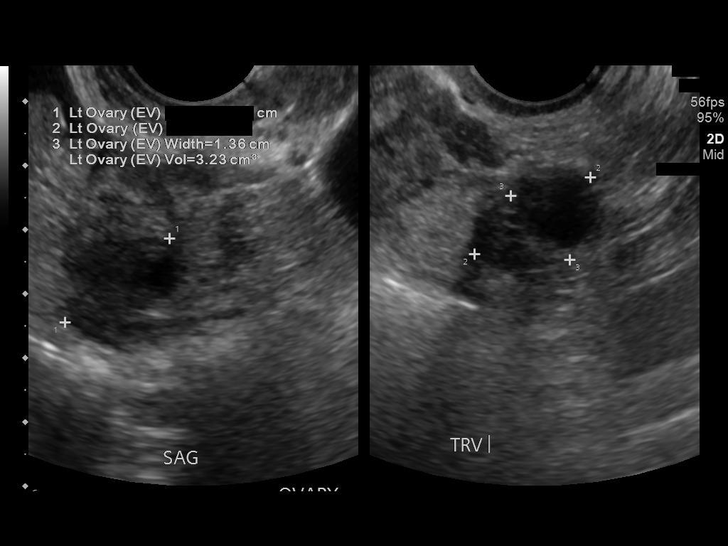
[im 46/46]
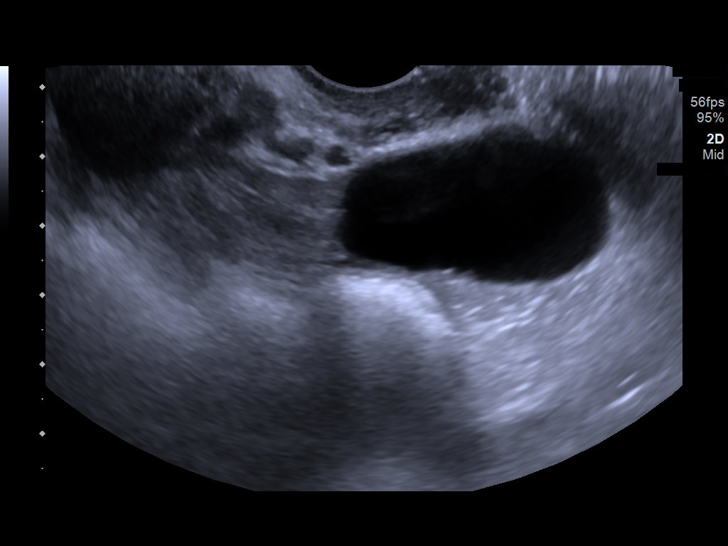

[13 of 25 positions shown; findings below may reference images not displayed]

FINDINGS: Uterus

Measurements: Anteverted, 7.4 x 3.7 x 4.3 centimeters = volume:
mL. No fibroid. There is fluid collection in the LEFT cornual region
measuring 1.2 centimeters and depth. This may represent entrapped
fluid from prior uterine ablation or synechiae.

Endometrium

Thickness: 7.1.  No focal abnormality visualized.

Right ovary

Measurements: 2.7 x 2.4 x 1.8 centimeters = volume: 6.1 mL. Simple
cyst is 2.1 x 1.5 x 1.1 centimeters

Left ovary

Measurements: 2.1 x 2.2 x 1.4 centimeters = volume: 3.2 mL. Normal
appearance of the LEFT ovary. Within the LEFT adnexal region there
is a tubular anechoic structure consistent with hydrosalpinx.

Pulsed Doppler evaluation of both ovaries demonstrates normal
low-resistance arterial and venous waveforms.

Other findings

No abnormal free fluid.
IMPRESSION: Focal fluid collection within the LEFT cornual region, possibly
representing entrapped endometrial fluid from prior ablation or
synechiae.

2.1 centimeters simple RIGHT ovarian cyst.

LEFT hydrosalpinx.  LEFT ovary is normal.

## 2020-11-06 ENCOUNTER — Encounter: Payer: Self-pay | Admitting: Internal Medicine

## 2021-01-21 ENCOUNTER — Encounter: Payer: Self-pay | Admitting: Internal Medicine

## 2021-01-21 ENCOUNTER — Ambulatory Visit (INDEPENDENT_AMBULATORY_CARE_PROVIDER_SITE_OTHER): Payer: 59 | Admitting: Internal Medicine

## 2021-01-21 VITALS — BP 114/76 | HR 100 | Ht 67.0 in | Wt 249.0 lb

## 2021-01-21 DIAGNOSIS — Z86010 Personal history of colonic polyps: Secondary | ICD-10-CM

## 2021-01-21 DIAGNOSIS — Z860101 Personal history of adenomatous and serrated colon polyps: Secondary | ICD-10-CM | POA: Insufficient documentation

## 2021-01-21 DIAGNOSIS — Z800 Family history of malignant neoplasm of digestive organs: Secondary | ICD-10-CM

## 2021-01-21 DIAGNOSIS — Z8371 Family history of colonic polyps: Secondary | ICD-10-CM | POA: Diagnosis not present

## 2021-01-21 DIAGNOSIS — Z1211 Encounter for screening for malignant neoplasm of colon: Secondary | ICD-10-CM

## 2021-01-21 HISTORY — PX: COLONOSCOPY W/ POLYPECTOMY: SHX1380

## 2021-01-21 HISTORY — DX: Personal history of colonic polyps: Z86.010

## 2021-01-21 HISTORY — DX: Personal history of adenomatous and serrated colon polyps: Z86.0101

## 2021-01-21 NOTE — Patient Instructions (Signed)
You have been scheduled for a colonoscopy. Please follow written instructions given to you at your visit today.  Please pick up your prep supplies at the pharmacy within the next 1-3 days. If you use inhalers (even only as needed), please bring them with you on the day of your procedure.   If you are age 41 or older, your body mass index should be between 23-30. Your Body mass index is 39 kg/m. If this is out of the aforementioned range listed, please consider follow up with your Primary Care Provider.  If you are age 40 or younger, your body mass index should be between 19-25. Your Body mass index is 39 kg/m. If this is out of the aformentioned range listed, please consider follow up with your Primary Care Provider.   ________________________________________________________  The [facility] providers would like to encourage you to use [facility] to communicate with providers for non-urgent requests or questions.  Due to long hold times on the telephone, sending your provider a message by [facility] may be a faster and more efficient way to get a response.  Please allow 48 business hours for a response.  Please remember that this is for non-urgent requests.  _______________________________________________________   I appreciate the opportunity to care for you. Silvano Rusk, MD, [facility]

## 2021-01-21 NOTE — Progress Notes (Signed)
Wendy Hicks 40 y.o. Dec 21, 1980 [id number]  Assessment & Plan:   Encounter Diagnoses  Name Primary?   Family history of colorectal cancer - 2 grandmothers Yes   Family history of colonic polyps    I think it is reasonable to perform a colonoscopy now.  She may be at increased risk and she is interested in being proactive so we will schedule a screening colonoscopy given the family history issues listed above.  The risks and benefits as well as alternatives of endoscopic procedure(s) have been discussed and reviewed. All questions answered. The patient agrees to proceed.  I appreciate the opportunity to care for this patient.  CC: Velna Hatchet, MD  Subjective:   Chief Complaint: Family history of colon cancer and polyps question timing of colonoscopy  HPI 40 year old woman here to discuss possible colonoscopy due to family history.  1 grandmother has had rectal cancer and the other grandmother has had colon cancer.  I think they were both in their 37s.  Her father has had numerous colon polyps over the years and in fact is a patient of mine and is followed very closely for numerous adenomas and also right sided larger hyperplastic polyps.  She has a sister who is 12 has had precancerous colon polyps.  The patient wonders if she should have a colonoscopy now as opposed to the standard starting age.  She does not have any active GI complaints. Allergies  Allergen Reactions   Dilaudid [Hydromorphone Hcl] Hives   Adhesive [Tape] Rash    "paper tape ok to use"   Current Meds  Medication Sig   amphetamine-dextroamphetamine (ADDERALL) 30 MG tablet Take 30 mg by mouth daily.   fexofenadine (ALLEGRA) 180 MG tablet Take 180 mg by mouth daily.   ibuprofen (ADVIL,MOTRIN) 200 MG tablet Take 600 mg by mouth every 6 (six) hours as needed for fever, headache or moderate pain.   Multiple Vitamin (MULTIVITAMIN WITH MINERALS) TABS tablet Take 1 tablet by mouth daily.   Past Medical  History:  Diagnosis Date   History of endometrial ablation    per pt done in GYN office in 2012   Hydrosalpinx    Left lower quadrant pain    Seasonal allergies    Past Surgical History:  Procedure Laterality Date   CESAREAN SECTION  04-15-2007   dr Ronita Hipps  @[facility]    CESAREAN SECTION W/BTL  04-02-2009   dr Ronita Hipps @[facility]    KNEE ARTHROSCOPY W/ ACL RECONSTRUCTION Left 2015   LAPAROSCOPIC CHOLECYSTECTOMY  06-03-2007  @[facility]    OOPHORECTOMY Left 12/2018   XI ROBOTIC ASSISTED SALPINGECTOMY Bilateral 01/13/2019   Procedure: XI ROBOTIC ASSISTED Right  SALPINGECTOMY,  Left Salpingo oophrectomy, Lysis of Omental and Cul De Sac  Adhesions;  Surgeon: Brien Few, MD;  Location: [facility];  Service: Gynecology;  Laterality: Bilateral;   Social History   Social History Narrative   Married 1 son and 2 daughters   Employed as a Nurse, learning disability   Former smoker   No alcohol   3 caffeinated beverages daily   No drug use no tobacco at all   family history includes Bladder Cancer in her paternal grandfather; Colon cancer in her maternal grandmother; Colon polyps in her father; Rectal cancer in her paternal grandmother.   Review of Systems As per HPI also a history of headaches and some menstrual cramp problems Otherwise negative Objective:   Physical Exam @BP  114/76   Pulse 100   Ht 5\' 7"  (1.702 m)  Wt 249 lb (112.9 kg)   BMI 39.00 kg/m @  General:  NAD well-developed well-nourished obese white woman in no acute distress Eyes:   anicteric Lungs:  clear Heart::  S1S2 no rubs, murmurs or gallops Abdomen:  soft and nontender, BS+ She is alert and oriented x3

## 2021-01-24 ENCOUNTER — Other Ambulatory Visit: Payer: Self-pay

## 2021-01-24 ENCOUNTER — Encounter: Payer: Self-pay | Admitting: Internal Medicine

## 2021-01-24 ENCOUNTER — Ambulatory Visit (AMBULATORY_SURGERY_CENTER): Payer: 59 | Admitting: Internal Medicine

## 2021-01-24 VITALS — BP 100/49 | HR 66 | Temp 97.5°F | Resp 12 | Ht 67.0 in | Wt 249.0 lb

## 2021-01-24 DIAGNOSIS — D124 Benign neoplasm of descending colon: Secondary | ICD-10-CM

## 2021-01-24 DIAGNOSIS — Z800 Family history of malignant neoplasm of digestive organs: Secondary | ICD-10-CM

## 2021-01-24 DIAGNOSIS — Z1211 Encounter for screening for malignant neoplasm of colon: Secondary | ICD-10-CM | POA: Diagnosis not present

## 2021-01-24 DIAGNOSIS — Z8371 Family history of colonic polyps: Secondary | ICD-10-CM | POA: Diagnosis not present

## 2021-01-24 HISTORY — PX: COLONOSCOPY W/ POLYPECTOMY: SHX1380

## 2021-01-24 MED ORDER — SODIUM CHLORIDE 0.9 % IV SOLN: 500.0000 mL | Freq: Once | INTRAVENOUS | Status: DC

## 2021-01-24 NOTE — Progress Notes (Signed)
Pt's states no medical or surgical changes since previsit or office visit. 

## 2021-01-24 NOTE — Progress Notes (Signed)
History and Physical Interval Note:  01/24/2021 9:14 AM  Wendy Hicks  has presented today for endoscopic procedure(s), with the diagnosis of  Encounter Diagnoses  Name Primary?   Family history of colon cancer Yes   Family history of colonic polyps   .  The various methods of evaluation and treatment have been discussed with the patient and/or family. After consideration of risks, benefits and other options for treatment, the patient has consented to  the endoscopic procedure(s).   The patient's history has been reviewed, patient examined, no change in status, stable for endoscopic procedure(s).  I have reviewed the patient's chart and labs.  Questions were answered to the patient's satisfaction.     Wendy Mayer, MD, [facility]

## 2021-01-24 NOTE — Progress Notes (Signed)
Called to room to assist during endoscopic procedure.  Patient ID and intended procedure confirmed with present staff. Received instructions for my participation in the procedure from the performing physician.  

## 2021-01-24 NOTE — Op Note (Signed)
[facility] Patient Name: Wendy Hicks Procedure Date: 01/24/2021 9:10 AM MRN: [id number] Endoscopist: Gatha Mayer , MD Age: 40 Referring MD:  Date of Birth: Sep 20, 1980 Gender: Female Account #: [id number] Procedure:                Colonoscopy Indications:              Colon cancer screening in patient at increased                            risk: Family history of colorectal cancer in                            multiple 2nd degree relatives 2 grandmothers and                            father with numerous polyps Medicines:                Propofol per Anesthesia, Monitored Anesthesia Care Procedure:                Pre-Anesthesia Assessment:                           - Prior to the procedure, a History and Physical                            was performed, and patient medications and                            allergies were reviewed. The patient's tolerance of                            previous anesthesia was also reviewed. The risks                            and benefits of the procedure and the sedation                            options and risks were discussed with the patient.                            All questions were answered, and informed consent                            was obtained. Prior Anticoagulants: The patient has                            taken no previous anticoagulant or antiplatelet                            agents. ASA Grade Assessment: II - A patient with                            mild systemic disease. After reviewing the risks  and benefits, the patient was deemed in                            satisfactory condition to undergo the procedure.                           After obtaining informed consent, the colonoscope                            was passed under direct vision. Throughout the                            procedure, the patient's blood pressure, pulse, and                            oxygen  saturations were monitored continuously. The                            Olympus PCF-H190DL [id number]) Colonoscope was                            introduced through the anus and advanced to the the                            cecum, identified by appendiceal orifice and                            ileocecal valve. The colonoscopy was performed                            without difficulty. The patient tolerated the                            procedure well. The quality of the bowel                            preparation was good. The bowel preparation used                            was Miralax via split dose instruction. The                            ileocecal valve, appendiceal orifice, and rectum                            were photographed. Scope In: 9:25:13 AM Scope Out: 9:38:37 AM Scope Withdrawal Time: 0 hours 10 minutes 49 seconds  Total Procedure Duration: 0 hours 13 minutes 24 seconds  Findings:                 The perianal and digital rectal examinations were                            normal.  A 5 mm polyp was found in the descending colon. The                            polyp was sessile. The polyp was removed with a                            cold snare. Resection and retrieval were complete.                            Verification of patient identification for the                            specimen was done. Estimated blood loss was                            minimal. Estimated blood loss was minimal.                           The exam was otherwise without abnormality on                            direct and retroflexion views. Complications:            No immediate complications. Estimated Blood Loss:     Estimated blood loss was minimal. Impression:               - One 5 mm polyp in the descending colon, removed                            with a cold snare. Resected and retrieved.                           - The examination was otherwise normal  on direct                            and retroflexion views.                           - FHx 2 grandmothers w/ CRCA and father with                            numerous polyps Recommendation:           - Patient has a contact number available for                            emergencies. The signs and symptoms of potential                            delayed complications were discussed with the                            patient. Return to normal activities tomorrow.  Written discharge instructions were provided to the                            patient.                           - Resume previous diet.                           - Continue present medications.                           - Repeat colonoscopy is recommended for                            surveillance. The colonoscopy date will be                            determined after pathology results from today's                            exam become available for review. Gatha Mayer, MD 01/24/2021 9:49:17 AM This report has been signed electronically.

## 2021-01-24 NOTE — Patient Instructions (Addendum)
I found and removed one small polyp. It looks benign. I will let you know pathology results and when to have another routine colonoscopy by mail and/or My Chart.  I appreciate the opportunity to care for you. Gatha Mayer, MD, FACG  YOU HAD AN ENDOSCOPIC PROCEDURE TODAY AT [facility]:   Refer to the procedure report that was given to you for any specific questions about what was found during the examination.  If the procedure report does not answer your questions, please call your gastroenterologist to clarify.  If you requested that your care partner not be given the details of your procedure findings, then the procedure report has been included in a sealed envelope for you to review at your convenience later.  **Handout given on polyps**  YOU SHOULD EXPECT: Some feelings of bloating in the abdomen. Passage of more gas than usual.  Walking can help get rid of the air that was put into your GI tract during the procedure and reduce the bloating. If you had a lower endoscopy (such as a colonoscopy or flexible sigmoidoscopy) you may notice spotting of blood in your stool or on the toilet paper. If you underwent a bowel prep for your procedure, you may not have a normal bowel movement for a few days.  Please Note:  You might notice some irritation and congestion in your nose or some drainage.  This is from the oxygen used during your procedure.  There is no need for concern and it should clear up in a day or so.  SYMPTOMS TO REPORT IMMEDIATELY:  Following lower endoscopy (colonoscopy or flexible sigmoidoscopy):  Excessive amounts of blood in the stool  Significant tenderness or worsening of abdominal pains  Swelling of the abdomen that is new, acute  Fever of 100F or higher  For urgent or emergent issues, a gastroenterologist can be reached at any hour by calling [phone]. Do not use MyChart messaging for urgent concerns.    DIET:  We do recommend a small meal at  first, but then you may proceed to your regular diet.  Drink plenty of fluids but you should avoid alcoholic beverages for 24 hours.  ACTIVITY:  You should plan to take it easy for the rest of today and you should NOT DRIVE or use heavy machinery until tomorrow (because of the sedation medicines used during the test).    FOLLOW UP: Our staff will call the number listed on your records 48-72 hours following your procedure to check on you and address any questions or concerns that you may have regarding the information given to you following your procedure. If we do not reach you, we will leave a message.  We will attempt to reach you two times.  During this call, we will ask if you have developed any symptoms of COVID 19. If you develop any symptoms (ie: fever, flu-like symptoms, shortness of breath, cough etc.) before then, please call [phone].  If you test positive for Covid 19 in the 2 weeks post procedure, please call and report this information to [place].    If any biopsies were taken you will be contacted by phone or by letter within the next 1-3 weeks.  Please call us at [phone] if you have not heard about the biopsies in 3 weeks.    SIGNATURES/CONFIDENTIALITY: You and/or your care partner have signed paperwork which will be entered into your electronic medical record.  These signatures attest to the fact that that the  information above on your After Visit Summary has been reviewed and is understood.  Full responsibility of the confidentiality of this discharge information lies with you and/or your care-partner.

## 2021-01-24 NOTE — Progress Notes (Signed)
Sedate, gd SR, tolerated procedure well, VSS, report to RN 

## 2021-01-28 ENCOUNTER — Telehealth: Payer: Self-pay

## 2021-01-28 NOTE — Telephone Encounter (Signed)
  Follow up Call-  Call back number 01/24/2021  Post procedure Call Back phone  # [phone]  Permission to leave phone message Yes  Some recent data might be hidden     Patient questions:  Do you have a fever, pain , or abdominal swelling? No. Pain Score  0 *  Have you tolerated food without any problems? Yes.    Have you been able to return to your normal activities? Yes.    Do you have any questions about your discharge instructions: Diet   No. Medications  No. Follow up visit  No.  Do you have questions or concerns about your Care? No.  Actions: * If pain score is 4 or above: No action needed, pain <4.

## 2021-01-28 NOTE — Telephone Encounter (Signed)
  Follow up Call-  Call back number 01/24/2021  Post procedure Call Back phone  # [phone]  Permission to leave phone message Yes  Some recent data might be hidden     Left message

## 2021-01-29 ENCOUNTER — Encounter: Payer: Self-pay | Admitting: Internal Medicine

## 2021-01-29 DIAGNOSIS — Z86010 Personal history of colonic polyps: Secondary | ICD-10-CM

## 2022-05-22 ENCOUNTER — Other Ambulatory Visit (HOSPITAL_COMMUNITY): Payer: Self-pay

## 2022-05-22 MED ORDER — WEGOVY 0.25 MG/0.5ML ~~LOC~~ SOAJ
0.2500 mg | SUBCUTANEOUS | 0 refills | Status: DC
  Filled 2022-05-22 – 2023-03-11 (×2): qty 2, 28d supply, fill #0

## 2022-05-27 ENCOUNTER — Other Ambulatory Visit (HOSPITAL_COMMUNITY): Payer: Self-pay

## 2022-06-05 ENCOUNTER — Other Ambulatory Visit (HOSPITAL_COMMUNITY): Payer: Self-pay

## 2022-08-20 ENCOUNTER — Other Ambulatory Visit: Payer: Self-pay

## 2022-08-20 MED ORDER — WEGOVY 0.25 MG/0.5ML ~~LOC~~ SOAJ
0.2500 mg | SUBCUTANEOUS | 0 refills | Status: DC
  Filled 2022-08-20: qty 2, 28d supply, fill #0

## 2022-09-11 ENCOUNTER — Other Ambulatory Visit: Payer: Self-pay

## 2022-09-11 MED ORDER — WEGOVY 0.5 MG/0.5ML ~~LOC~~ SOAJ
SUBCUTANEOUS | 3 refills | Status: DC
  Filled 2022-09-11: qty 2, 28d supply, fill #0

## 2022-09-17 ENCOUNTER — Other Ambulatory Visit: Payer: Self-pay

## 2022-10-16 ENCOUNTER — Other Ambulatory Visit: Payer: Self-pay

## 2022-10-16 MED ORDER — WEGOVY 1 MG/0.5ML ~~LOC~~ SOAJ
1.0000 mg | SUBCUTANEOUS | 4 refills | Status: AC
  Filled 2022-10-16: qty 2, 28d supply, fill #0
  Filled 2022-11-08 – 2022-11-17 (×2): qty 2, 28d supply, fill #1

## 2022-11-09 ENCOUNTER — Other Ambulatory Visit: Payer: Self-pay

## 2022-11-16 ENCOUNTER — Other Ambulatory Visit: Payer: Self-pay

## 2022-11-17 ENCOUNTER — Other Ambulatory Visit: Payer: Self-pay

## 2022-12-08 ENCOUNTER — Other Ambulatory Visit: Payer: Self-pay

## 2022-12-08 MED ORDER — WEGOVY 2.4 MG/0.75ML ~~LOC~~ SOAJ
2.4000 mg | SUBCUTANEOUS | 4 refills | Status: DC
  Filled 2022-12-08: qty 3, 28d supply, fill #0

## 2023-01-12 ENCOUNTER — Other Ambulatory Visit: Payer: Self-pay

## 2023-01-12 MED ORDER — ALPRAZOLAM 0.5 MG PO TABS
0.5000 mg | ORAL_TABLET | Freq: Two times a day (BID) | ORAL | 0 refills | Status: AC | PRN
  Filled 2023-01-12: qty 60, 30d supply, fill #0

## 2023-01-12 MED ORDER — WEGOVY 2.4 MG/0.75ML ~~LOC~~ SOAJ
2.4000 mg | SUBCUTANEOUS | 4 refills | Status: DC
  Filled 2023-01-12: qty 3, 28d supply, fill #0

## 2023-01-21 ENCOUNTER — Other Ambulatory Visit: Payer: Self-pay

## 2023-02-11 ENCOUNTER — Other Ambulatory Visit: Payer: Self-pay

## 2023-02-11 MED ORDER — WEGOVY 2.4 MG/0.75ML ~~LOC~~ SOAJ
2.4000 mg | SUBCUTANEOUS | 3 refills | Status: DC
  Filled 2023-02-11: qty 3, 28d supply, fill #0

## 2023-02-12 ENCOUNTER — Other Ambulatory Visit: Payer: Self-pay

## 2023-03-11 ENCOUNTER — Other Ambulatory Visit: Payer: Self-pay

## 2023-03-11 MED ORDER — WEGOVY 2.4 MG/0.75ML ~~LOC~~ SOAJ
2.4000 mg | SUBCUTANEOUS | 3 refills | Status: DC
  Filled 2023-03-11 (×7): qty 3, 28d supply, fill #0

## 2023-03-15 ENCOUNTER — Other Ambulatory Visit: Payer: Self-pay

## 2023-03-22 ENCOUNTER — Other Ambulatory Visit: Payer: Self-pay

## 2023-04-23 ENCOUNTER — Other Ambulatory Visit: Payer: Self-pay

## 2023-04-23 MED ORDER — WEGOVY 2.4 MG/0.75ML ~~LOC~~ SOAJ
2.4000 mg | SUBCUTANEOUS | 3 refills | Status: DC
  Filled 2023-04-23: qty 3, 28d supply, fill #0

## 2023-05-25 ENCOUNTER — Other Ambulatory Visit: Payer: Self-pay

## 2023-05-25 MED ORDER — AMPHETAMINE-DEXTROAMPHETAMINE 20 MG PO TABS
20.0000 mg | ORAL_TABLET | Freq: Two times a day (BID) | ORAL | 0 refills | Status: DC | PRN
  Filled 2023-05-25: qty 60, 30d supply, fill #0

## 2023-05-27 ENCOUNTER — Other Ambulatory Visit: Payer: Self-pay

## 2023-09-06 ENCOUNTER — Other Ambulatory Visit: Payer: Self-pay

## 2023-09-06 ENCOUNTER — Emergency Department (HOSPITAL_BASED_OUTPATIENT_CLINIC_OR_DEPARTMENT_OTHER)
Admission: EM | Admit: 2023-09-06 | Discharge: 2023-09-06 | Discharge status: Against Medical Advice | Attending: Emergency Medicine | Admitting: Emergency Medicine

## 2023-09-06 ENCOUNTER — Encounter (HOSPITAL_BASED_OUTPATIENT_CLINIC_OR_DEPARTMENT_OTHER): Payer: Self-pay

## 2023-09-06 DIAGNOSIS — R1032 Left lower quadrant pain: Secondary | ICD-10-CM | POA: Insufficient documentation

## 2023-09-06 DIAGNOSIS — Z5329 Procedure and treatment not carried out because of patient's decision for other reasons: Secondary | ICD-10-CM | POA: Insufficient documentation

## 2023-09-06 DIAGNOSIS — R109 Unspecified abdominal pain: Secondary | ICD-10-CM

## 2023-09-06 DIAGNOSIS — R102 Pelvic and perineal pain: Secondary | ICD-10-CM | POA: Diagnosis not present

## 2023-09-06 NOTE — ED Triage Notes (Signed)
 Pt c/o stabbing pains in L side of urterus onset approx 3a. Denies NV, pregnancy. Hx of same prior to L ovary removed 2020, tubal at the same time.  2 tylenol , meloxicam PTA, it's subsiding.

## 2023-09-06 NOTE — ED Notes (Signed)
 Pt stated that her pain was improving and that she wanted to leave... Provider informed... Provider went to talk to the Pt... Provider verbally said that the Pt was able to leave and that they weren't going have anything performed.Wendy AasAaron Hicks

## 2023-09-07 NOTE — ED Provider Notes (Signed)
 Presented with stabbing pain left lower abdomen/pelvis that began at 3AM.  Initially called OBGYN but because pain was so severe came to ED. Reports pain improved now and no longer wishes to have emergency department visit. Discussed it is possible that pain could be diverticulitis, ectopic pregnancy, nephrolithiasis or other emergent pathology and offered evaluation however she declines at this time.    Scarlette Currier, MD 09/07/23 [phone]

## 2023-09-08 ENCOUNTER — Other Ambulatory Visit: Payer: Self-pay

## 2023-09-08 ENCOUNTER — Emergency Department (HOSPITAL_BASED_OUTPATIENT_CLINIC_OR_DEPARTMENT_OTHER)
Admission: EM | Admit: 2023-09-08 | Discharge: 2023-09-08 | Disposition: A | Discharge status: Home / Self Care | Attending: Emergency Medicine | Admitting: Emergency Medicine

## 2023-09-08 ENCOUNTER — Emergency Department (HOSPITAL_BASED_OUTPATIENT_CLINIC_OR_DEPARTMENT_OTHER)

## 2023-09-08 ENCOUNTER — Encounter (HOSPITAL_BASED_OUTPATIENT_CLINIC_OR_DEPARTMENT_OTHER): Payer: Self-pay

## 2023-09-08 DIAGNOSIS — R102 Pelvic and perineal pain: Secondary | ICD-10-CM | POA: Insufficient documentation

## 2023-09-08 DIAGNOSIS — R1030 Lower abdominal pain, unspecified: Secondary | ICD-10-CM | POA: Diagnosis present

## 2023-09-08 DIAGNOSIS — Z87891 Personal history of nicotine dependence: Secondary | ICD-10-CM | POA: Diagnosis not present

## 2023-09-08 LAB — CBC
HCT: 41.3 % (ref 36.0–46.0)
Hemoglobin: 14.7 g/dL (ref 12.0–15.0)
MCH: 30.4 pg (ref 26.0–34.0)
MCHC: 35.6 g/dL (ref 30.0–36.0)
MCV: 85.3 fL (ref 80.0–100.0)
Platelets: 225 10*3/uL (ref 150–400)
RBC: 4.84 MIL/uL (ref 3.87–5.11)
RDW: 12.5 % (ref 11.5–15.5)
WBC: 5.2 10*3/uL (ref 4.0–10.5)
nRBC: 0 % (ref 0.0–0.2)

## 2023-09-08 LAB — URINALYSIS, ROUTINE W REFLEX MICROSCOPIC
Bacteria, UA: NONE SEEN
Bilirubin Urine: NEGATIVE
Glucose, UA: NEGATIVE mg/dL
Hgb urine dipstick: NEGATIVE
Ketones, ur: NEGATIVE mg/dL
Leukocytes,Ua: NEGATIVE
Nitrite: NEGATIVE
Specific Gravity, Urine: 1.03 (ref 1.005–1.030)
pH: 7 (ref 5.0–8.0)

## 2023-09-08 LAB — COMPREHENSIVE METABOLIC PANEL WITH GFR
ALT: 13 U/L (ref 0–44)
AST: 19 U/L (ref 15–41)
Albumin: 4.4 g/dL (ref 3.5–5.0)
Alkaline Phosphatase: 75 U/L (ref 38–126)
Anion gap: 16 — ABNORMAL HIGH (ref 5–15)
BUN: 14 mg/dL (ref 6–20)
CO2: 21 mmol/L — ABNORMAL LOW (ref 22–32)
Calcium: 9.9 mg/dL (ref 8.9–10.3)
Chloride: 103 mmol/L (ref 98–111)
Creatinine, Ser: 0.87 mg/dL (ref 0.44–1.00)
GFR, Estimated: >60 mL/min (ref >60)
Glucose, Bld: 109 mg/dL — ABNORMAL HIGH (ref 70–99)
Potassium: 3.5 mmol/L (ref 3.5–5.1)
Sodium: 140 mmol/L (ref 135–145)
Total Bilirubin: 0.5 mg/dL (ref 0.0–1.2)
Total Protein: 7.2 g/dL (ref 6.5–8.1)

## 2023-09-08 LAB — LIPASE, BLOOD: Lipase: 83 U/L — ABNORMAL HIGH (ref 11–51)

## 2023-09-08 LAB — HCG, SERUM, QUALITATIVE: Preg, Serum: NEGATIVE

## 2023-09-08 MED ORDER — FENTANYL CITRATE PF 50 MCG/ML IJ SOSY
100.0000 ug | PREFILLED_SYRINGE | Freq: Once | INTRAMUSCULAR | Status: AC
  Administered 2023-09-08: 100 ug via INTRAVENOUS
  Filled 2023-09-08: qty 2

## 2023-09-08 MED ORDER — ONDANSETRON HCL 4 MG/2ML IJ SOLN
4.0000 mg | Freq: Once | INTRAMUSCULAR | Status: AC
  Administered 2023-09-08: 4 mg via INTRAVENOUS
  Filled 2023-09-08: qty 2

## 2023-09-08 MED ORDER — KETOROLAC TROMETHAMINE 15 MG/ML IJ SOLN
15.0000 mg | Freq: Once | INTRAMUSCULAR | Status: AC
  Administered 2023-09-08: 15 mg via INTRAVENOUS
  Filled 2023-09-08: qty 1

## 2023-09-08 MED ORDER — OXYCODONE-ACETAMINOPHEN 5-325 MG PO TABS: 1.0000 | ORAL_TABLET | Freq: Four times a day (QID) | ORAL | 0 refills | Status: DC | PRN

## 2023-09-08 MED ORDER — SODIUM CHLORIDE 0.9 % IV BOLUS
1000.0000 mL | Freq: Once | INTRAVENOUS | Status: AC
  Administered 2023-09-08: 1000 mL via INTRAVENOUS

## 2023-09-08 MED ORDER — MORPHINE SULFATE (PF) 4 MG/ML IV SOLN
4.0000 mg | Freq: Once | INTRAVENOUS | Status: AC
  Administered 2023-09-08: 4 mg via INTRAVENOUS
  Filled 2023-09-08: qty 1

## 2023-09-08 MED ORDER — FENTANYL CITRATE PF 50 MCG/ML IJ SOSY: 50.0000 ug | PREFILLED_SYRINGE | Freq: Once | INTRAMUSCULAR | Status: DC

## 2023-09-08 NOTE — ED Provider Notes (Signed)
 Assumed care from Dr. Harless Lien at 7 AM.  Patient here with abdominal pain in the lower quadrant.  CT renal study and ultrasound negative for acute pathology other than a 1.9 x 1.9 x 2.7 cm area in the endometrium recommending a sonohysterogram.  Patient was waiting on a UA.  Urine without evidence of signs of infection.  Low suspicion for pancreatitis, renal stone, pyelonephritis.  Suspect the pelvic etiology of her symptoms.  She has an appointment with her OB/GYN tomorrow.  Findings discussed with the patient.  At this time she is stable for discharge and further workup with her specialist.   Almond Army, MD 09/08/23 [phone]

## 2023-09-08 NOTE — ED Provider Notes (Signed)
 [facility] Emergency Department Provider Note MRN:  [id number]  Arrival date & time: 09/08/23     Chief Complaint   Abdominal Pain   History of Present Illness   Wendy Hicks is a 43 y.o. year-old female with a history of endometrial ablation presenting to the ED with chief complaint of abdominal pain.  Acute severe lower abdominal pain this evening.  Has had 1 other similar episode but it went away while she was in the waiting room and left without being seen.  Pain is excruciating.  No flank pain, no fever, nausea but no vomiting.  Review of Systems  A thorough review of systems was obtained and all systems are negative except as noted in the HPI and PMH.   Patient's Health History    Past Medical History:  Diagnosis Date  . History of endometrial ablation    per pt done in GYN office in 2012  . Hx of adenomatous polyp of colon 01/2021   5 mm  . Hydrosalpinx   . Left lower quadrant pain   . Seasonal allergies     Past Surgical History:  Procedure Laterality Date  . CESAREAN SECTION  04-15-2007   dr taavon  @[facility]   . CESAREAN SECTION W/BTL  04-02-2009   dr Harless Lien @[facility]   . COLONOSCOPY W/ POLYPECTOMY  01/2021  . KNEE ARTHROSCOPY W/ ACL RECONSTRUCTION Left 2015  . LAPAROSCOPIC CHOLECYSTECTOMY  06-03-2007  @[facility]   . OOPHORECTOMY Left 12/2018  . XI ROBOTIC ASSISTED SALPINGECTOMY Bilateral 01/13/2019   Procedure: XI ROBOTIC ASSISTED Right  SALPINGECTOMY,  Left Salpingo oophrectomy, Lysis of Omental and Cul De Sac  Adhesions;  Surgeon: Meriam Stamp, MD;  Location: [facility] ;  Service: Gynecology;  Laterality: Bilateral;    Family History  Problem Relation Age of Onset  . Colon polyps Father   . Liver cancer Maternal Uncle   . Colon cancer Maternal Grandmother        age 22 (77)  . Rectal cancer Paternal Grandmother        age 46  . Bladder Cancer Paternal Grandfather   . Stomach cancer Neg Hx   . Esophageal cancer Neg Hx      Social History   Socioeconomic History  . Marital status: Married    Spouse name: Not on file  . Number of children: 3  . Years of education: Not on file  . Highest education level: Not on file  Occupational History  . Not on file  Tobacco Use  . Smoking status: Former    Current packs/day: 0.50    Average packs/day: 0.5 packs/day for 15.0 years (7.5 ttl pk-yrs)    Types: Cigarettes  . Smokeless tobacco: Never  Vaping Use  . Vaping status: Never Used  Substance and Sexual Activity  . Alcohol use: No  . Drug use: Never  . Sexual activity: Yes    Birth control/protection: Surgical  Other Topics Concern  . Not on file  Social History Narrative   Married 1 son and 2 daughters   Employed as a Engineer, agricultural   Former smoker   No alcohol   3 caffeinated beverages daily   No drug use no tobacco at all   Social Drivers of Corporate investment banker Strain: Not on file  Food Insecurity: Not on file  Transportation Needs: Not on file  Physical Activity: Not on file  Stress: Not on file  Social Connections: Unknown (08/02/2021)   Received from [facility]  Social Network   . Social Network: Not on file  Intimate Partner Violence: Unknown (08/02/2021)   Received [facility]   HITS   . Physically Hurt: Not on file   . Insult or Talk Down To: Not on file   . Threaten Physical Harm: Not on file   . Scream or Curse: Not on file     Physical Exam   Vitals:   09/08/23 0423 09/08/23 0445  BP: (!) 142/101 (!) 149/70  Pulse: (!) 105 80  Resp: (!) 24   Temp: 97.6 F (36.4 C)   SpO2: 100% 99%    CONSTITUTIONAL: Well-appearing, NAD NEURO/PSYCH:  Alert and oriented x 3, no focal deficits EYES:  eyes equal and reactive ENT/NECK:  no LAD, no JVD CARDIO: Regular rate, well-perfused, normal S1 and S2 PULM:  CTAB no wheezing or rhonchi GI/GU:  non-distended, non-tender MSK/SPINE:  No gross deformities, no edema SKIN:  no rash, atraumatic   *Additional and/or  pertinent findings included in MDM below  Diagnostic and Interventional Summary    EKG Interpretation Date/Time:    Ventricular Rate:    PR Interval:    QRS Duration:    QT Interval:    QTC Calculation:   R Axis:      Text Interpretation:        *** Labs Reviewed  CBC  LIPASE, BLOOD  COMPREHENSIVE METABOLIC PANEL WITH GFR  URINALYSIS, ROUTINE W REFLEX MICROSCOPIC  PREGNANCY, URINE  HCG, SERUM, QUALITATIVE    CT RENAL STONE STUDY    (Results Pending)    Medications  ketorolac  (TORADOL ) 15 MG/ML injection 15 mg (15 mg Intravenous Given 09/08/23 0438)  sodium chloride  0.9 % bolus 1,000 mL (1,000 mLs Intravenous New Bag/Given 09/08/23 0446)  ondansetron  (ZOFRAN ) injection 4 mg (4 mg Intravenous Given 09/08/23 0439)  fentaNYL  (SUBLIMAZE ) injection 100 mcg (100 mcg Intravenous Given 09/08/23 0438)     Procedures  /  Critical Care Procedures  ED Course and Medical Decision Making  Initial Impression and Ddx Differential diagnosis includes kidney stone, ovarian torsion.  Patient in severe pain.  Providing pain control, awaiting CT.  Past medical/surgical history that increases complexity of ED encounter:  ***  Interpretation of Diagnostics I personally reviewed the {BEROINTERP:26835} and my interpretation is as follows:  ***  ***  Patient Reassessment and Ultimate Disposition/Management     ***  Patient management required discussion with the following services or consulting groups:  {BEROCONSULT:26841}  Complexity of Problems Addressed {BEROCOPA:26833}  Additional Data Reviewed and Analyzed Further history obtained from: {BERODATA:26834}  Additional Factors Impacting ED Encounter Risk {BERORISK:26838}  Merrick Abe. Harless Lien, MD [facility] [email or web address]  Final Clinical Impressions(s) / ED Diagnoses  No diagnosis found.  ED Discharge Orders     None        Discharge Instructions Discussed with and  Provided to Patient:   Discharge Instructions   None

## 2023-09-08 NOTE — ED Triage Notes (Signed)
 Pt coming in with sudden onset severe sharp pain in bilateral lower abdomen starting 2 hrs ago with associated vomiting. Denies fever. Reports fever. Pt in acute distress in triage. Pt here 2 days ago for similar symptoms and has a follow up appointment later today. Pt states pain had gone away entirely and she felt fine until 0200.

## 2023-11-24 ENCOUNTER — Other Ambulatory Visit: Payer: Self-pay | Admitting: Obstetrics

## 2023-11-26 ENCOUNTER — Other Ambulatory Visit: Payer: Self-pay | Admitting: Obstetrics

## 2024-01-14 ENCOUNTER — Encounter (HOSPITAL_COMMUNITY): Payer: Self-pay | Admitting: Obstetrics

## 2024-01-18 ENCOUNTER — Encounter (HOSPITAL_COMMUNITY): Payer: Self-pay

## 2024-01-18 ENCOUNTER — Encounter (HOSPITAL_COMMUNITY): Payer: Self-pay | Admitting: Obstetrics

## 2024-01-18 NOTE — Progress Notes (Signed)
 Spoke w/ via phone for pre-op interview--- pt Lab needs dos----     cbc/ bmp/ t&s/ upt    Lab results------ no COVID test -----patient states asymptomatic no test needed Arrive at ------- 0530 on 01-20-2024 NPO after MN w/ exception sips of water w/ meds Pre-Surgery Ensure or G2: n/a  Med rec completed Medications to take morning of surgery ----- allegra/ if needed xanax / flexeril Diabetic medication ----- n/a  GLP1 agonist last dose: pt stated had stopped take wegovy  back 06/ 2025 GLP1 instructions:  Patient instructed no nail polish to be worn day of surgery Patient instructed to bring photo id and insurance card day of surgery Patient aware to have Driver (ride ) / caregiver    for 24 hours after surgery - husband , Wendy Hicks Patient Special Instructions -----  pt stated she is out of town in [place] , will not be back in town until last evening night before surgery.  Sent surgical instructions to patient via her MyChart account in epic.  Pt verbalized understanding of instructions.  She is unable to obtain hibiclens soap prior surgery will take a soap shower morning of surgery. Pre-Op special Instructions ----- n/a  Patient verbalized understanding of instructions that were given at this phone interview. Patient denies chest pain, sob, fever, cough at the interview.

## 2024-01-20 ENCOUNTER — Other Ambulatory Visit: Payer: Self-pay

## 2024-01-20 ENCOUNTER — Encounter (HOSPITAL_COMMUNITY): Payer: Self-pay | Admitting: Obstetrics

## 2024-01-20 ENCOUNTER — Ambulatory Visit (HOSPITAL_COMMUNITY)

## 2024-01-20 ENCOUNTER — Encounter (HOSPITAL_COMMUNITY)
Admission: RE | Disposition: A | Discharge status: Home / Self Care | Payer: Self-pay | Source: Home / Self Care | Attending: Obstetrics

## 2024-01-20 ENCOUNTER — Observation Stay (HOSPITAL_COMMUNITY)
Admission: RE | Admit: 2024-01-20 | Discharge: 2024-01-21 | Disposition: A | Discharge status: Home / Self Care | Attending: Obstetrics | Admitting: Obstetrics

## 2024-01-20 DIAGNOSIS — N9981 Other intraoperative complications of genitourinary system: Secondary | ICD-10-CM

## 2024-01-20 DIAGNOSIS — N857 Hematometra: Secondary | ICD-10-CM | POA: Diagnosis present

## 2024-01-20 DIAGNOSIS — N9972 Accidental puncture and laceration of a genitourinary system organ or structure during other procedure: Secondary | ICD-10-CM | POA: Diagnosis not present

## 2024-01-20 DIAGNOSIS — Z01818 Encounter for other preprocedural examination: Principal | ICD-10-CM

## 2024-01-20 DIAGNOSIS — Z87891 Personal history of nicotine dependence: Secondary | ICD-10-CM | POA: Diagnosis not present

## 2024-01-20 HISTORY — DX: Attention-deficit hyperactivity disorder, unspecified type: F90.9

## 2024-01-20 HISTORY — PX: BLADDER REPAIR: SHX6721

## 2024-01-20 HISTORY — DX: Hematometra: N85.7

## 2024-01-20 HISTORY — PX: ROBOTIC ASSISTED LAPAROSCOPIC LYSIS OF ADHESION: SHX6080

## 2024-01-20 HISTORY — DX: Anxiety disorder, unspecified: F41.9

## 2024-01-20 HISTORY — DX: Presence of spectacles and contact lenses: Z97.3

## 2024-01-20 HISTORY — DX: Major depressive disorder, single episode, unspecified: F32.9

## 2024-01-20 HISTORY — PX: CYSTOSCOPY: SHX5120

## 2024-01-20 HISTORY — PX: HYSTERECTOMY, TOTAL, LAPAROSCOPIC, ROBOT-ASSISTED WITH SALPINGECTOMY: SHX7587

## 2024-01-20 HISTORY — DX: Pelvic and perineal pain unspecified side: R10.20

## 2024-01-20 LAB — BASIC METABOLIC PANEL WITH GFR
Anion gap: 12 (ref 5–15)
BUN: 6 mg/dL (ref 6–20)
CO2: 23 mmol/L (ref 22–32)
Calcium: 8.5 mg/dL — ABNORMAL LOW (ref 8.9–10.3)
Chloride: 104 mmol/L (ref 98–111)
Creatinine, Ser: 0.77 mg/dL (ref 0.44–1.00)
GFR, Estimated: >60 mL/min (ref >60)
Glucose, Bld: 83 mg/dL (ref 70–99)
Potassium: 3.8 mmol/L (ref 3.5–5.1)
Sodium: 139 mmol/L (ref 135–145)

## 2024-01-20 LAB — CBC
HCT: 38.9 % (ref 36.0–46.0)
Hemoglobin: 13.8 g/dL (ref 12.0–15.0)
MCH: 30.7 pg (ref 26.0–34.0)
MCHC: 35.5 g/dL (ref 30.0–36.0)
MCV: 86.6 fL (ref 80.0–100.0)
Platelets: 180 K/uL (ref 150–400)
RBC: 4.49 MIL/uL (ref 3.87–5.11)
RDW: 12.9 % (ref 11.5–15.5)
WBC: 3.9 K/uL — ABNORMAL LOW (ref 4.0–10.5)
nRBC: 0 % (ref 0.0–0.2)

## 2024-01-20 LAB — POCT PREGNANCY, URINE: Preg Test, Ur: NEGATIVE

## 2024-01-20 LAB — TYPE AND SCREEN
ABO/RH(D): A POS
Antibody Screen: NEGATIVE

## 2024-01-20 LAB — ABO/RH: ABO/RH(D): A POS

## 2024-01-20 SURGERY — HYSTERECTOMY, TOTAL, LAPAROSCOPIC, ROBOT-ASSISTED WITH SALPINGECTOMY
Anesthesia: General | Site: Pelvis

## 2024-01-20 MED ORDER — DEXMEDETOMIDINE HCL IN NACL 80 MCG/20ML IV SOLN
INTRAVENOUS | Status: AC
  Filled 2024-01-20: qty 20

## 2024-01-20 MED ORDER — KETOROLAC TROMETHAMINE 30 MG/ML IJ SOLN
30.0000 mg | Freq: Four times a day (QID) | INTRAMUSCULAR | Status: DC
  Administered 2024-01-20 – 2024-01-21 (×3): 30 mg via INTRAVENOUS
  Filled 2024-01-20 (×3): qty 1

## 2024-01-20 MED ORDER — FENTANYL CITRATE (PF) 100 MCG/2ML IJ SOLN
25.0000 ug | INTRAMUSCULAR | Status: DC | PRN
  Administered 2024-01-20: 50 ug via INTRAVENOUS

## 2024-01-20 MED ORDER — CEFAZOLIN SODIUM-DEXTROSE 2-4 GM/100ML-% IV SOLN
2.0000 g | INTRAVENOUS | Status: AC
  Administered 2024-01-20: 2 g via INTRAVENOUS

## 2024-01-20 MED ORDER — PROPOFOL 10 MG/ML IV BOLUS
INTRAVENOUS | Status: AC
Start: 2024-01-20 — End: 2024-01-20
  Filled 2024-01-20: qty 20

## 2024-01-20 MED ORDER — FENTANYL CITRATE (PF) 100 MCG/2ML IJ SOLN
INTRAMUSCULAR | Status: AC
  Filled 2024-01-20: qty 2

## 2024-01-20 MED ORDER — ONDANSETRON HCL 4 MG/2ML IJ SOLN: 4.0000 mg | Freq: Once | INTRAMUSCULAR | Status: DC | PRN

## 2024-01-20 MED ORDER — OXYCODONE HCL 5 MG PO TABS: 5.0000 mg | ORAL_TABLET | ORAL | Status: DC | PRN

## 2024-01-20 MED ORDER — ONDANSETRON HCL 4 MG/2ML IJ SOLN
INTRAMUSCULAR | Status: AC
  Filled 2024-01-20: qty 2

## 2024-01-20 MED ORDER — ROCURONIUM BROMIDE 10 MG/ML (PF) SYRINGE
PREFILLED_SYRINGE | INTRAVENOUS | Status: DC | PRN
  Administered 2024-01-20 (×2): 50 mg via INTRAVENOUS
  Administered 2024-01-20 (×2): 20 mg via INTRAVENOUS
  Administered 2024-01-20: 80 mg via INTRAVENOUS

## 2024-01-20 MED ORDER — KETOROLAC TROMETHAMINE 30 MG/ML IJ SOLN
INTRAMUSCULAR | Status: DC | PRN
  Administered 2024-01-20: 30 mg via INTRAVENOUS

## 2024-01-20 MED ORDER — KETAMINE HCL 10 MG/ML IJ SOLN
INTRAMUSCULAR | Status: DC | PRN
  Administered 2024-01-20: 10 mg via INTRAVENOUS
  Administered 2024-01-20: 100 mg via INTRAVENOUS
  Administered 2024-01-20: 30 mg via INTRAVENOUS
  Administered 2024-01-20: 10 mg via INTRAVENOUS

## 2024-01-20 MED ORDER — DEXAMETHASONE SOD PHOSPHATE PF 10 MG/ML IJ SOLN
INTRAMUSCULAR | Status: DC | PRN
  Administered 2024-01-20: 4 mg via INTRAVENOUS

## 2024-01-20 MED ORDER — MIDAZOLAM HCL (PF) 2 MG/2ML IJ SOLN
INTRAMUSCULAR | Status: DC | PRN
Start: 2024-01-20 — End: 2024-01-20
  Administered 2024-01-20: 2 mg via INTRAVENOUS

## 2024-01-20 MED ORDER — 0.9 % SODIUM CHLORIDE (POUR BTL) OPTIME
TOPICAL | Status: DC | PRN
  Administered 2024-01-20: 1000 mL

## 2024-01-20 MED ORDER — ONDANSETRON HCL 4 MG PO TABS: 4.0000 mg | ORAL_TABLET | Freq: Four times a day (QID) | ORAL | Status: DC | PRN

## 2024-01-20 MED ORDER — SENNOSIDES-DOCUSATE SODIUM 8.6-50 MG PO TABS
2.0000 | ORAL_TABLET | Freq: Every day | ORAL | Status: DC
  Administered 2024-01-20: 2 via ORAL
  Filled 2024-01-20: qty 2

## 2024-01-20 MED ORDER — ACETAMINOPHEN 10 MG/ML IV SOLN
INTRAVENOUS | Status: AC
  Filled 2024-01-20: qty 100

## 2024-01-20 MED ORDER — FENTANYL CITRATE (PF) 250 MCG/5ML IJ SOLN
INTRAMUSCULAR | Status: DC | PRN
  Administered 2024-01-20 (×2): 50 ug via INTRAVENOUS
  Administered 2024-01-20: 100 ug via INTRAVENOUS

## 2024-01-20 MED ORDER — ROCURONIUM BROMIDE 10 MG/ML (PF) SYRINGE
PREFILLED_SYRINGE | INTRAVENOUS | Status: AC
Start: 2024-01-20 — End: 2024-01-20
  Filled 2024-01-20: qty 10

## 2024-01-20 MED ORDER — BUPIVACAINE HCL (PF) 0.25 % IJ SOLN
INTRAMUSCULAR | Status: AC
  Filled 2024-01-20: qty 30

## 2024-01-20 MED ORDER — ROPIVACAINE HCL 5 MG/ML IJ SOLN
INTRAMUSCULAR | Status: AC
  Filled 2024-01-20: qty 30

## 2024-01-20 MED ORDER — ALBUMIN HUMAN 5 % IV SOLN: INTRAVENOUS | Status: DC | PRN

## 2024-01-20 MED ORDER — PANTOPRAZOLE SODIUM 40 MG PO TBEC
40.0000 mg | DELAYED_RELEASE_TABLET | Freq: Every day | ORAL | Status: DC
  Filled 2024-01-20: qty 1

## 2024-01-20 MED ORDER — ORAL CARE MOUTH RINSE: 15.0000 mL | Freq: Once | OROMUCOSAL | Status: AC

## 2024-01-20 MED ORDER — METHOCARBAMOL 1000 MG/10ML IJ SOLN
500.0000 mg | Freq: Once | INTRAMUSCULAR | Status: AC
  Administered 2024-01-20: 500 mg via INTRAVENOUS

## 2024-01-20 MED ORDER — MORPHINE SULFATE (PF) 2 MG/ML IV SOLN
1.0000 mg | INTRAVENOUS | Status: DC | PRN
  Administered 2024-01-20: 1 mg via INTRAVENOUS
  Filled 2024-01-20: qty 1

## 2024-01-20 MED ORDER — CHLORHEXIDINE GLUCONATE 0.12 % MT SOLN
15.0000 mL | Freq: Once | OROMUCOSAL | Status: AC
  Administered 2024-01-20: 15 mL via OROMUCOSAL

## 2024-01-20 MED ORDER — KETOROLAC TROMETHAMINE 0.5 % OP SOLN
OPHTHALMIC | Status: AC
  Filled 2024-01-20: qty 5

## 2024-01-20 MED ORDER — SODIUM CHLORIDE 0.9 % IV SOLN
INTRAVENOUS | Status: DC | PRN
  Administered 2024-01-20: 60 mL

## 2024-01-20 MED ORDER — ACETAMINOPHEN 500 MG PO TABS
1000.0000 mg | ORAL_TABLET | Freq: Four times a day (QID) | ORAL | Status: DC
  Administered 2024-01-20 – 2024-01-21 (×3): 1000 mg via ORAL
  Filled 2024-01-20 (×3): qty 2

## 2024-01-20 MED ORDER — LACTATED RINGERS IV SOLN: INTRAVENOUS | Status: DC

## 2024-01-20 MED ORDER — BSS IO SOLN: 15.0000 mL | Freq: Once | INTRAOCULAR | Status: DC

## 2024-01-20 MED ORDER — BUPIVACAINE HCL (PF) 0.25 % IJ SOLN
INTRAMUSCULAR | Status: DC | PRN
Start: 2024-01-20 — End: 2024-01-20
  Administered 2024-01-20: 15 mL

## 2024-01-20 MED ORDER — ACETAMINOPHEN 10 MG/ML IV SOLN: 1000.0000 mg | Freq: Once | INTRAVENOUS | Status: DC | PRN

## 2024-01-20 MED ORDER — SCOPOLAMINE 1 MG/3DAYS TD PT72
MEDICATED_PATCH | TRANSDERMAL | Status: AC
  Filled 2024-01-20: qty 1

## 2024-01-20 MED ORDER — CEFAZOLIN SODIUM-DEXTROSE 2-4 GM/100ML-% IV SOLN
INTRAVENOUS | Status: AC
  Filled 2024-01-20: qty 100

## 2024-01-20 MED ORDER — SIMETHICONE 80 MG PO CHEW: 80.0000 mg | CHEWABLE_TABLET | Freq: Four times a day (QID) | ORAL | Status: DC | PRN

## 2024-01-20 MED ORDER — IBUPROFEN 600 MG PO TABS: 600.0000 mg | ORAL_TABLET | Freq: Four times a day (QID) | ORAL | Status: DC

## 2024-01-20 MED ORDER — MENTHOL 3 MG MT LOZG
1.0000 | LOZENGE | OROMUCOSAL | Status: DC | PRN
  Administered 2024-01-20: 3 mg via ORAL
  Filled 2024-01-20: qty 9

## 2024-01-20 MED ORDER — OXYCODONE HCL 5 MG PO TABS: 5.0000 mg | ORAL_TABLET | Freq: Once | ORAL | Status: DC | PRN

## 2024-01-20 MED ORDER — PHENAZOPYRIDINE HCL 200 MG PO TABS
200.0000 mg | ORAL_TABLET | Freq: Three times a day (TID) | ORAL | Status: DC
  Administered 2024-01-20 – 2024-01-21 (×3): 200 mg via ORAL
  Filled 2024-01-20 (×5): qty 1

## 2024-01-20 MED ORDER — MIDAZOLAM HCL 2 MG/2ML IJ SOLN
INTRAMUSCULAR | Status: AC
  Filled 2024-01-20: qty 2

## 2024-01-20 MED ORDER — ONDANSETRON HCL 4 MG/2ML IJ SOLN: 4.0000 mg | Freq: Four times a day (QID) | INTRAMUSCULAR | Status: DC | PRN

## 2024-01-20 MED ORDER — SODIUM CHLORIDE (PF) 0.9 % IJ SOLN
INTRAMUSCULAR | Status: AC
  Filled 2024-01-20: qty 50

## 2024-01-20 MED ORDER — CHLORHEXIDINE GLUCONATE 0.12 % MT SOLN
OROMUCOSAL | Status: AC
  Filled 2024-01-20: qty 15

## 2024-01-20 MED ORDER — LACTATED RINGERS IV SOLN: INTRAVENOUS | Status: DC | PRN

## 2024-01-20 MED ORDER — KETOROLAC TROMETHAMINE 30 MG/ML IJ SOLN
INTRAMUSCULAR | Status: AC
  Filled 2024-01-20: qty 1

## 2024-01-20 MED ORDER — SCOPOLAMINE 1 MG/3DAYS TD PT72
1.0000 | MEDICATED_PATCH | TRANSDERMAL | Status: DC
  Administered 2024-01-20: 1 mg via TRANSDERMAL
  Filled 2024-01-20: qty 1

## 2024-01-20 MED ORDER — KETOROLAC TROMETHAMINE 0.5 % OP SOLN
1.0000 [drp] | Freq: Four times a day (QID) | OPHTHALMIC | Status: DC
  Administered 2024-01-20: 1 [drp] via OPHTHALMIC

## 2024-01-20 MED ORDER — PROPOFOL 10 MG/ML IV BOLUS
INTRAVENOUS | Status: DC | PRN
  Administered 2024-01-20: 170 mg via INTRAVENOUS

## 2024-01-20 MED ORDER — OXYCODONE HCL 5 MG/5ML PO SOLN: 5.0000 mg | Freq: Once | ORAL | Status: DC | PRN

## 2024-01-20 MED ORDER — KETAMINE HCL 50 MG/5ML IJ SOSY
PREFILLED_SYRINGE | INTRAMUSCULAR | Status: AC
  Filled 2024-01-20: qty 5

## 2024-01-20 MED ORDER — LIDOCAINE 2% (20 MG/ML) 5 ML SYRINGE
INTRAMUSCULAR | Status: DC | PRN
  Administered 2024-01-20: 100 mg via INTRAVENOUS

## 2024-01-20 MED ORDER — HEMOSTATIC AGENTS (NO CHARGE) OPTIME
TOPICAL | Status: DC | PRN
  Administered 2024-01-20: 1

## 2024-01-20 MED ORDER — ACETAMINOPHEN 10 MG/ML IV SOLN
INTRAVENOUS | Status: DC | PRN
  Administered 2024-01-20: 1000 mg via INTRAVENOUS

## 2024-01-20 MED ORDER — SODIUM CHLORIDE 0.9 % IR SOLN
Status: DC | PRN
  Administered 2024-01-20: 1000 mL via INTRAVESICAL
  Administered 2024-01-20: 1000 mL

## 2024-01-20 MED ORDER — DROPERIDOL 2.5 MG/ML IJ SOLN: 0.6250 mg | Freq: Once | INTRAMUSCULAR | Status: DC | PRN

## 2024-01-20 MED ORDER — POVIDONE-IODINE 10 % EX SWAB
2.0000 | Freq: Once | CUTANEOUS | Status: AC
  Administered 2024-01-20: 2 via TOPICAL

## 2024-01-20 MED ORDER — SUGAMMADEX SODIUM 200 MG/2ML IV SOLN
INTRAVENOUS | Status: DC | PRN
  Administered 2024-01-20: 400 mg via INTRAVENOUS

## 2024-01-20 MED ORDER — FLUORESCEIN SODIUM 10 % IV SOLN
INTRAVENOUS | Status: AC
Start: 2024-01-20 — End: 2024-01-20
  Filled 2024-01-20: qty 5

## 2024-01-20 MED ORDER — LIDOCAINE 2% (20 MG/ML) 5 ML SYRINGE
INTRAMUSCULAR | Status: AC
  Filled 2024-01-20: qty 5

## 2024-01-20 MED ORDER — METHOCARBAMOL 1000 MG/10ML IJ SOLN
INTRAMUSCULAR | Status: AC
  Filled 2024-01-20: qty 10

## 2024-01-20 MED ORDER — DEXMEDETOMIDINE HCL IN NACL 80 MCG/20ML IV SOLN
INTRAVENOUS | Status: DC | PRN
  Administered 2024-01-20 (×2): 12 ug via INTRAVENOUS

## 2024-01-20 MED ORDER — PROPOFOL 10 MG/ML IV BOLUS
INTRAVENOUS | Status: AC
  Filled 2024-01-20: qty 20

## 2024-01-20 MED ORDER — ONDANSETRON HCL 4 MG/2ML IJ SOLN
INTRAMUSCULAR | Status: DC | PRN
  Administered 2024-01-20: 4 mg via INTRAVENOUS

## 2024-01-20 SURGICAL SUPPLY — 50 items
APPLICATOR ARISTA FLEXITIP XL (MISCELLANEOUS) IMPLANT
BARRIER ADHS 3X4 INTERCEED (GAUZE/BANDAGES/DRESSINGS) IMPLANT
BLADE SURG 10 STRL SS (BLADE) IMPLANT
CHLORAPREP W/TINT 26 (MISCELLANEOUS) ×4 IMPLANT
CNTNR URN SCR LID CUP LEK RST (MISCELLANEOUS) ×4 IMPLANT
COVER BACK TABLE 60X90IN (DRAPES) ×4 IMPLANT
COVER MAYO STAND STRL (DRAPES) ×4 IMPLANT
COVER TIP SHEARS 8 DVNC (MISCELLANEOUS) ×4 IMPLANT
DEFOGGER SCOPE WARM SEASHARP (MISCELLANEOUS) ×4 IMPLANT
DERMABOND ADVANCED .7 DNX12 (GAUZE/BANDAGES/DRESSINGS) ×4 IMPLANT
DRAPE ARM DVNC X/XI (DISPOSABLE) ×16 IMPLANT
DRAPE COLUMN DVNC XI (DISPOSABLE) ×4 IMPLANT
DRAPE SURG IRRIG POUCH 19X23 (DRAPES) ×4 IMPLANT
DRAPE UTILITY XL STRL (DRAPES) ×4 IMPLANT
DRIVER NDL MEGA SUTCUT DVNCXI (INSTRUMENTS) ×4 IMPLANT
DRIVER NDLE MEGA SUTCUT DVNCXI (INSTRUMENTS) ×4 IMPLANT
ELECTRODE REM PT RTRN 9FT ADLT (ELECTROSURGICAL) ×4 IMPLANT
FORCEPS LONG TIP 8 DVNC XI (FORCEP) ×4 IMPLANT
FORCEPS PROGRASP DVNC XI (FORCEP) ×4 IMPLANT
GAUZE PETROLATUM 1 X8 (GAUZE/BANDAGES/DRESSINGS) IMPLANT
GLOVE BIO SURGEON STRL SZ 6.5 (GLOVE) ×12 IMPLANT
GLOVE BIO SURGEON STRL SZ7 (GLOVE) IMPLANT
GLOVE BIOGEL PI IND STRL 6 (GLOVE) IMPLANT
GLOVE BIOGEL PI IND STRL 7.0 (GLOVE) ×20 IMPLANT
GLOVE BIOGEL PI MICRO STRL 5.5 (GLOVE) IMPLANT
HEMOSTAT ARISTA ABSORB 3G PWDR (HEMOSTASIS) IMPLANT
HIBICLENS CHG 4% 4OZ BTL (MISCELLANEOUS) ×4 IMPLANT
IRRIGATION STRYKERFLOW (MISCELLANEOUS) ×4 IMPLANT
KIT PINK PAD W/HEAD ARM REST (MISCELLANEOUS) ×4 IMPLANT
OBTURATOR OPTICALSTD 8 DVNC (TROCAR) ×4 IMPLANT
OCCLUDER COLPOPNEUMO (BALLOONS) ×4 IMPLANT
PACK ROBOT WH (CUSTOM PROCEDURE TRAY) ×4 IMPLANT
PACK ROBOTIC GOWN (GOWN DISPOSABLE) ×4 IMPLANT
PAD OB MATERNITY 11 LF (PERSONAL CARE ITEMS) ×4 IMPLANT
SCISSORS MNPLR CVD DVNC XI (INSTRUMENTS) ×4 IMPLANT
SEAL UNIV 5-12 XI (MISCELLANEOUS) ×16 IMPLANT
SEALER VESSEL EXT DVNC XI (MISCELLANEOUS) ×4 IMPLANT
SET TRI-LUMEN FLTR TB AIRSEAL (TUBING) ×4 IMPLANT
SPIKE FLUID TRANSFER (MISCELLANEOUS) ×8 IMPLANT
SUT VIC AB 0 CT1 27XBRD ANBCTR (SUTURE) ×8 IMPLANT
SUT VIC AB 3-0 SH 27XBRD (SUTURE) IMPLANT
SUT VIC AB 4-0 PS2 27 (SUTURE) ×8 IMPLANT
SUT VICRYL 0 UR6 27IN ABS (SUTURE) ×4 IMPLANT
SUT VLOC 180 0 9IN GS21 (SUTURE) ×4 IMPLANT
TIP UTERINE 6.7X10CM GRN DISP (MISCELLANEOUS) IMPLANT
TIP UTERINE 6.7X8CM BLUE DISP (MISCELLANEOUS) IMPLANT
TOWEL GREEN STERILE (TOWEL DISPOSABLE) ×4 IMPLANT
TRAY FOLEY W/BAG SLVR 16FR ST (SET/KITS/TRAYS/PACK) ×4 IMPLANT
TROCAR PORT AIRSEAL 8X120 (TROCAR) ×4 IMPLANT
UNDERPAD 30X36 HEAVY ABSORB (UNDERPADS AND DIAPERS) ×4 IMPLANT

## 2024-01-20 NOTE — Op Note (Signed)
 Operative Note  Preoperative Diagnosis: Bladder injury  Postoperative Diagnosis: Bladder injury  Procedures performed:  Cystourethroscopy, robotic assisted laparoscopic cystorrhaphy  Implants: None  Attending Surgeon: Lianne Leila Gillis, MD  Assistant Surgeon: Burnard Bowers, MD  Assistant: n/a  Anesthesia: General endotracheal  Findings: 1. On cystoscopy, normal urethral mucosa without injury or lesion. Brisk bilateral ureteral efflux present.  Normal appearing bladder mucosa with thinned translucent area at right of midline bladder dome corresponding with concerning area of dissection from hysterectomy.  Midline area of erythema above the trigone corresponding to  approximately 1cm superior to the vaginal cuff closure.  2. On laparoscopy, no extravasation of cystoscopic fluid noted, no full thickness injury noted.  Right of midline bladder dome distal from vaginal cuff with no thermal injury noted around edges of imbrication.  Specimens:  ID Type Source Tests Collected by Time Destination  1 : uterus and cervix Tissue PATH Gyn benign resection SURGICAL PATHOLOGY Bowers Burnard, MD 01/20/2024 1016     Estimated blood loss: 5 mL (for the cystorrhaphy)  IV fluids: 1350 mL   Urine output: 410 mL for total procedure  Complications: None  Procedure in Detail: Called to OR for concerns of bladder injury during colpotomy. She was in dorsolithotomy position in yellowfin stirrups with arms tucked in Trendelenburg position. Please refer to Dr. Phylliss operative report for hysterectomy. Areas of concern were noted right of midline bladder dome distal from vaginal cuff with no thermal injury at the base of the defect with minimal signs of cautery noted distal to edges of planned imbrication. Area of bleeding noted at midline around 1cm above the vaginal cuff, close examination with irrigated noted hemostasis. The sterile Foley catheter was removed after closure of vaginal cuff.     A  70-degree cystoscope was introduced and normal urethral mucosa without injury or lesion noted. Brisk bilateral ureteral efflux present. A 360-degree inspection revealed no lesion or foreign body in the bladder.   A thinned translucent area at right of midline bladder dome corresponding with concerning area of dissection from hysterectomy.  Midline area of erythema above the trigone corresponding to  approximately 1cm superior to the vaginal cuff closure. No extravasation of cystoscopic fluid noted, no full thickness injury noted. The bladder was drained and the cystoscope was removed.  The Foley catheter was replaced.  Two imbricating layers were placed in a continuous running fashion with 3-0 vicryl suture over the area of defect noted right of midline bladder dome distal from vaginal cuff. The repair was noted to be tension free and hemostatic. An additional interrupted suture was placed over area of bleeding noted at midline around 1cm above the vaginal cuff with a 3-0 Vicryl suture. All surgical sites were noted to be hemostatic and irrigation was used to remove any fluids or blood clots.   The Foley catheter was removed.  A 70-degree cystoscope was re-introduced, and 360-degree inspection revealed no injury, lesion or foreign body in the bladder. No sutures or defect was noted in the bladder.  Brisk bilateral ureteral efflux was noted.  The bladder was drained and the cystoscope was removed.  The Foley catheter was replaced. Patient tolerated the procedure well.   Dr. Bowers resumed her portion of the procedure with plans to remove foley catheter on Monday.

## 2024-01-20 NOTE — Transfer of Care (Signed)
 Immediate Anesthesia Transfer of Care Note  Patient: Wendy Hicks  Procedure(s) Performed: HYSTERECTOMY, TOTAL, LAPAROSCOPIC, ROBOT-ASSISTED WITH SALPINGECTOMY (Bilateral: Abdomen) LYSIS, ADHESIONS, ROBOT-ASSISTED, LAPAROSCOPIC (Pelvis) CYSTOSCOPY (Bladder) REPAIR, BLADDER  Patient Location: PACU  Anesthesia Type:General  Level of Consciousness: awake and oriented  Airway & Oxygen Therapy: Patient Spontanous Breathing and Patient connected to face mask oxygen  Post-op Assessment: Report given to RN and Post -op Vital signs reviewed and stable  Post vital signs: Reviewed and stable  Last Vitals:  Vitals Value Taken Time  BP 122/71 01/20/24 11:45  Temp    Pulse 71 01/20/24 11:49  Resp 15 01/20/24 11:49  SpO2 100 % 01/20/24 11:49  Vitals shown include unfiled device data.  Last Pain:  Vitals:   01/20/24 0601  TempSrc: Oral  PainSc: 4       Patients Stated Pain Goal: 4 (01/20/24 0601)  Complications: There were no known notable events for this encounter.

## 2024-01-20 NOTE — Anesthesia Preprocedure Evaluation (Addendum)
 Anesthesia Evaluation  Patient identified by MRN, date of birth, ID band Patient awake    Reviewed: Allergy & Precautions, NPO status , Patient's Chart, lab work & pertinent test results  History of Anesthesia Complications Negative for: history of anesthetic complications  Airway Mallampati: I  TM Distance: >3 FB Neck ROM: Full    Dental  (+) Teeth Intact, Dental Advisory Given   Pulmonary former smoker   breath sounds clear to auscultation       Cardiovascular negative cardio ROS  Rhythm:Regular Rate:Normal     Neuro/Psych  PSYCHIATRIC DISORDERS Anxiety Depression    Chronic Benzo Use   GI/Hepatic ,neg GERD  ,,  Endo/Other    Renal/GU Renal disease     Musculoskeletal   Abdominal   Peds  Hematology   Anesthesia Other Findings   Reproductive/Obstetrics                              Anesthesia Physical Anesthesia Plan  ASA: 2  Anesthesia Plan: General   Post-op Pain Management:    Induction: Intravenous  PONV Risk Score and Plan: 2 and Ondansetron , Dexamethasone , Scopolamine  patch - Pre-op, Midazolam  and Treatment may vary due to age or medical condition  Airway Management Planned: Oral ETT  Additional Equipment:   Intra-op Plan:   Post-operative Plan: Extubation in OR  Informed Consent:      Dental advisory given  Plan Discussed with: CRNA and Surgeon  Anesthesia Plan Comments:          Anesthesia Quick Evaluation

## 2024-01-20 NOTE — Plan of Care (Signed)
  Problem: Health Behavior/Discharge Planning: Goal: Ability to manage health-related needs will improve Outcome: Progressing   Problem: Clinical Measurements: Goal: Ability to maintain clinical measurements within normal limits will improve Outcome: Progressing Goal: Will remain free from infection Outcome: Progressing Goal: Diagnostic test results will improve Outcome: Progressing Goal: Respiratory complications will improve Outcome: Progressing Goal: Cardiovascular complication will be avoided Outcome: Progressing   Problem: Activity: Goal: Risk for activity intolerance will decrease Outcome: Progressing   Problem: Nutrition: Goal: Adequate nutrition will be maintained Outcome: Progressing   Problem: Coping: Goal: Level of anxiety will decrease Outcome: Progressing   Problem: Elimination: Goal: Will not experience complications related to bowel motility Outcome: Progressing Goal: Will not experience complications related to urinary retention Outcome: Progressing   Problem: Pain Managment: Goal: General experience of comfort will improve and/or be controlled Outcome: Progressing   Problem: Safety: Goal: Ability to remain free from injury will improve Outcome: Progressing   Problem: Skin Integrity: Goal: Risk for impaired skin integrity will decrease Outcome: Progressing   Problem: Education: Goal: Knowledge of the prescribed therapeutic regimen will improve Outcome: Progressing Goal: Understanding of sexual limitations or changes related to disease process or condition will improve Outcome: Progressing Goal: Individualized Educational Video(s) Outcome: Progressing   Problem: Self-Concept: Goal: Communication of feelings regarding changes in body function or appearance will improve Outcome: Progressing   Problem: Skin Integrity: Goal: Demonstration of wound healing without infection will improve Outcome: Progressing

## 2024-01-20 NOTE — Anesthesia Procedure Notes (Signed)
 Procedure Name: Intubation Date/Time: 01/20/2024 7:45 AM  Performed by: Waddell Lauraine NOVAK, MDPre-anesthesia Checklist: Patient identified, Emergency Drugs available, Suction available and Patient being monitored Patient Re-evaluated:Patient Re-evaluated prior to induction Oxygen Delivery Method: Circle system utilized Preoxygenation: Pre-oxygenation with 100% oxygen Induction Type: IV induction Ventilation: Mask ventilation without difficulty Laryngoscope Size: Mac and 4 Grade View: Grade I Tube type: Oral Tube size: 7.0 mm Number of attempts: 1 Airway Equipment and Method: Stylet and Oral airway Placement Confirmation: ETT inserted through vocal cords under direct vision, positive ETCO2 and breath sounds checked- equal and bilateral Secured at: 23 cm Tube secured with: Tape Dental Injury: Teeth and Oropharynx as per pre-operative assessment

## 2024-01-20 NOTE — Anesthesia Postprocedure Evaluation (Signed)
 Anesthesia Post Note  Patient: Wendy Hicks  Procedure(s) Performed: HYSTERECTOMY, TOTAL, LAPAROSCOPIC, ROBOT-ASSISTED WITH SALPINGECTOMY (Bilateral: Abdomen) LYSIS, ADHESIONS, ROBOT-ASSISTED, LAPAROSCOPIC (Pelvis) CYSTOSCOPY (Bladder) REPAIR, BLADDER     Patient location during evaluation: PACU Anesthesia Type: General Level of consciousness: awake Pain management: pain level controlled Vital Signs Assessment: post-procedure vital signs reviewed and stable Respiratory status: spontaneous breathing Cardiovascular status: blood pressure returned to baseline Postop Assessment: no apparent nausea or vomiting and adequate PO intake Anesthetic complications: yes (Possible Corneal Abrasion)   There were no known notable events for this encounter.                  Lauraine KATHEE [place]

## 2024-01-20 NOTE — Op Note (Signed)
 01/20/2024  12:04 PM  PATIENT:  Wendy Hicks  43 y.o. female  PRE-OPERATIVE DIAGNOSIS:  hematometra, chronic pelvic pain  POST-OPERATIVE DIAGNOSIS:  hematometra, chronic pelvic pain  PROCEDURE:  Procedure(s): HYSTERECTOMY, TOTAL, LAPAROSCOPIC, ROBOT-ASSISTED WITH SALPINGECTOMY (Bilateral) LYSIS, ADHESIONS, ROBOT-ASSISTED, LAPAROSCOPIC CYSTOSCOPY (N/A) REPAIR, BLADDER (N/A)  SURGEON:  Surgeons and Role: Panel 1:    * Kandyce Sor, MD - Primary    * Barbette Knock, MD Panel 2:    * Guadlupe Lianne DASEN, MD - Primary  PHYSICIAN ASSISTANT: None  ASSISTANTS: As above  ANESTHESIA:   local and general  EBL:  50 mL   BLOOD ADMINISTERED:none  DRAINS: Urinary Catheter (Foley)   LOCAL MEDICATIONS USED:  MARCAINE     and BUPIVICAINE   SPECIMEN:  Source of Specimen:  Uterus and cervix  DISPOSITION OF SPECIMEN:  PATHOLOGY  COUNTS:  YES  TOURNIQUET:  * No tourniquets in log *  DICTATION: .Note written in EPIC  PLAN OF CARE: Admit for overnight observation  PATIENT DISPOSITION:  PACU - hemodynamically stable.   Delay start of Pharmacological VTE agent (>24hrs) due to surgical blood loss or risk of bleeding: yes   Procedure:Robotic-assisted total laparoscopic hysterectomy with ovarian retention, cystoscopy, oversew of bladder, lysis of adhesions  Complications: Partial-thickness denuding of the dome of the bladder Antibiotics: 2 g Ancef  Findings: Enlarged uterus with left cornual fibroid.  Bivalved at the end of the case showing fundal hematometra in a 3 cm cavity separated from the 2 cm lower cavity by a 1 cm thick band in the midportion of the uterus, absent left tube and ovary, normal right ovary with only small fragments of distal right tube and fimbria. nl liver edge, normal appendix, Hemostasis post-procedure with peristalsis of bilateral ureters and bilateral ureteral jets  Indications: This is a 43 year old G2, P3 with history of chronic pelvic pain and ultrasound  finding of hematometra, now 10 years post endometrial ablation.  Options for hormonal control were declined by the patient and requested definitive surgical management.  Procedure: After informed consent obtained, the patient was taken to the operating room where general anesthesia was initiated without difficulty.   She was prepped and draped in normal sterile fashion in the dorsal supine lithotomy position. A Foley catheter was inserted sterilely into the bladder. A bimanual examination was done to assess the size and position of the uterus. A weighted speculum was placed in the vagina and deaver retractors were used on the anterior vaginal wall. .The cervix was grasped with tenaculum. Cervix was dilated and sounded to 8 cm knowing that this was not the full length of the uterus.. The cervix was assessed to identify the Rumi-Co size.  A small cup and an 8 cm shaft was used. The uterine balloon was unable to be inflated, this was thought due to intrauterine adhesions from the prior ablation.  Gloved were changed. Attention was then turned to the patient's abdomen. 0.5 % marcaine  was used prior to all incision. A total of 10 cc of marcaine  was used.  A 10 mm incision was made in the umbilicus and blunt and sharp dissection was done until the fascia was identified. This was then grasped with Kocher clamps x2 and entered sharply. A pursestring suture of 0 Vicryl was then placed along the incision and a non-bladed Norval was inserted into the peritoneal cavity. Intraperitoneal placement was confirmed with the use of the camera and pneumoperitoneum was created to 15 mm of mercury. The pursestring suture was secured around the port  and pneumoperitoneum was maintained. Brief survey of the abdomen and pelvis was done with findings as above. The abdominal wall was assessed and additional port sites were marked. 8 mm incisions were placed in the right (two) and left lower quadrants (2) and non-bladed ports were placed  under direct visualization. The robot was brought to the patient's side and attached with the left side docking. The robotic instruments were placed under direct visualization until proper placement just over the uterus.   I then went to the robotic console. Brief survey of the patient's abdomen and pelvis revealed  findings as above.  An omental adhesion just under the umbilical port was noticed and impairing visualization.  This was taken down with the bipolar vessel sealer.  Much improvement to the field-of-view was noted.  Liver edge and appendix were normal.  Left tube and ovary were absent.  Right ovary appeared normal.    I began on the right side: the right ureter and  IP were identified. The right fallopian tube was absent.  The right utero-ovarian ligament was serially cauterized and transected with the vessel sealer until the right ovary fell laterally. The right round ligament was cauterized and divided.The anterior leaf of the broad ligament was dissected bluntly then monopolar cautery was used to separate the anterior and posterior leafs of the broad ligament. This was carried down through to the bladder flap.  Of note the bladder flap was fatty and slightly edematous and planes were difficult to ascertain.  Most dissection was blunt however this did create some bleeding and spot cautery was needed.  I used the [employer] ring as a guide but during the dissection was concerned that the [place] ring may not be in the proper place.  Attention was then turned to the patient's left side: The tube and ovary were absent from a prior surgery.  The left round  ligament was cauterized and transected. The anterior and posterior leaves of the broad ligament were bluntly and sharply dissected apart from each other. Monopolar cautery was used to come across the anterior leaf of the right broad ligament. This dissection was carried down to the bladder flap.  At this point given the adhesions, fatty tissue and lack of  normal planes the bladder was filled to 150 cc.  We could see the bladder margins and that the dissection had denuded the bladder just to the right of midline.  No leak was noted and no full-thickness injury was noted.  At this point I went back down to the patient's vagina and decided to change to a larger Koh ring for better definition of the cervicovaginal junction.  Again with blunt dissection and cautious use of monopolar cautery bladder dissection was begun, closer to the uterus and with knowledge of the margins of the bladder given the bladder distention.  While further dissecting to identify the left uterine artery the plane between the bladder and cervix became more apparent.  During this dissection a hysterotomy was created and the [facility] balloon pierced through the anterior uterine wall.  This was not unexpected given the inability to pass the Rumi Koh shaft through the endometrium due to the known adhesions.   The left uterine artery was serially cauterized with the vessel sealer and transected.  On the right side additional dissection was done of the bladder.  The right uterine artery and then serially cauterized with the vessel sealer and transected. The uterus was placed on stretch to allow better visualization of the  arteries. The bladder flap was further taken down both sharply and with cautery. Cautery was used for hemostasis on several places along the bladder flap. At this point with the pressure inward on the Rumi, the anterior colpotomy was made with the monopolar scissors. This was carried around to the patient's right side. Additional bipolar cautery was used on the  both angles of the cuff- this was carried out with the vessel sealer. The uterus was then positioned  anteriorly  to allow easy access to the posterior  colpotomy. This was carried out with the monopolar scissors.  Good hemostasis was noted along the angles of the incision.  With manipulation of the specimen, the uterus and  cervix cervix  were pulled out through the vagina.    A 9 inch V-lock suture was placed though the umbilical port. Instruments were changed to allow a suture cut needle driver through the right port and and a long-tipped forceps through the left port. The right angle was closed and locked with the V-lock suture. Running suture was carried out tothe L angle.  A more superficial layer of suture was placed travelling back to the right angle. The suture was cut and removed.  The vaginal cuff was palpated vaginally by an assistant and felt to have good approximation and closure.    Irrigation was used and there was small bleeding on the anterior vaginal cuff.  At this point Dr.Yuen from urogynecology was present to help evaluate the denuded bladder.  She performed a cystoscopy which showed a very thin but intact bladder wall.  She did a 2 layer closure of the denuded bladder wall.  See her dictation.  She also through a figure-of-eight suture at the bleeder on the anterior vaginal cuff for excellent hemostasis.  She finished with a second cystoscopy which showed good closure, intact bladder and no sutures in the bladder.  Bilateral ureters were visualized and jetting clear urine.   By standard laparoscopy the pelvis was irrigated and evaluated.  Suction irrigation was used to clear blood and fluid from the pelvis.  The patient was placed in reverse Trendelenburg, all additional fluid was suctioned from the abdomen and pelvis the cuff was reinspected and  found to be hemostatic. All pedicles were also found to be hemostatic.  Arista was placed over the vaginal cuff. 30 CC 1/4% bupivicaine was placed into the peritoneal cavity.  Under direct visualization the ports were removed. Pneumoperitoneum was released and the umbilical port was removed.  The  pursestring suture at the umbilicus was then closed. No additional fascial incisions were closed due to the small size and the nondilated nature of these incisions. A 4-0  Vicryl was used to close the additional laparoscopic ports sites. Good hemostasis was noted.  Sponge lap and needle counts were correct x3 the patient was woken from general anesthesia having tolerated the procedure well and taken to the recovery room in a stable fashion.

## 2024-01-20 NOTE — H&P (Signed)
 Chief complaint: Pelvic pain, hematometra  History present illness: 43 year old G2, P3 here for robotic assisted total laparoscopic hysterectomy with bilateral salpingectomy and ovarian retention for chronic pain that began about 10 years after an endometrial ablation.  Ultrasound is consistent with hematometra.  Pain has progressed from cyclic to chronic.  Recent ultrasound: Uterus 8.7 x 4.6 x 5.5 cm with a 2 x 2 x 2.7 cm focal thickening of the endometrial canal near the fundus.  Sonohysterogram: 2 connecting pockets but unable to access the upper pocket due to thick band of adhesions  Normal Pap HPV - February 25 Endometrial biopsy deferred due to adhesions was  Past surgical history: Cesarean section x 2, laparoscopic cholecystectomy, laparoscopic tubal ligation with removal of left ovary.  Endometrial ablation.  Past Surgical History:  Procedure Laterality Date   CESAREAN SECTION  04/15/2007   @[facility]  by Dr Gorge   CESAREAN SECTION W/BTL  04/02/2009   @[facility]   by Dr Gorge   COLONOSCOPY W/ POLYPECTOMY  01/24/2021   dr avram   HYSTEROSCOPY W/ ENDOMETRIAL ABLATION  06/02/2011   GYN office  By Dr Gorge w/ sedation   KNEE ARTHROSCOPY W/ ACL RECONSTRUCTION Left 2015   LAPAROSCOPIC CHOLECYSTECTOMY  06/03/2007   @[facility]   by Dr KYM Laughter   XI ROBOTIC ASSISTED SALPINGECTOMY Bilateral 01/13/2019   Procedure: XI ROBOTIC ASSISTED Right  SALPINGECTOMY,  Left Salpingo oophrectomy, Lysis of Omental and [place]  Adhesions;  Surgeon: Gorge Ade, MD;  Location: [facility];  Service: Gynecology;  Laterality: Bilateral;     Past Medical History:  Diagnosis Date   ADHD (attention deficit hyperactivity disorder)    Anxiety disorder    Hematometra    Hx of adenomatous polyp of colon 01/2021   5 mm   Left lower quadrant pain    MDD (major depressive disorder)    Pelvic pain    Seasonal allergies    Wears glasses    Daily medications: Adderall, narcotics as needed for  pain  Physical exam: Vitals:   01/20/24 0601  BP: 114/69  Pulse: 75  Resp: 16  Temp: 98 F (36.7 C)  SpO2: 99%   General: Well-appearing no distress Abdomen: Soft, nontender GU: Deferred to the OR Skin: Warm and dry  UPT negative CBC and CMP reviewed and normal  Assessment and plan: 43 year old G2, P3 with chronic pelvic pain due to hematometra.  Plan robotic assisted total laparoscopic hysterectomy with bilateral salpingectomy.  Patient understands risk of bleeding infection damage to surrounding organs.  Patient understands possibility of needing an open procedure.  Patient understands no guarantee that this will resolve her pain.  Patient excepts blood in the event of emergency.  Risks and benefits were reviewed.  Burnard DELENA Bowers 01/20/2024 7:26 AM

## 2024-01-20 NOTE — Plan of Care (Signed)
   Problem: Education: Goal: Knowledge of General Education information will improve Description: Including pain rating scale, medication(s)/side effects and non-pharmacologic comfort measures Outcome: Completed/Met

## 2024-01-20 NOTE — Brief Op Note (Signed)
 01/20/2024  12:04 PM  PATIENT:  Wendy Hicks  43 y.o. female  PRE-OPERATIVE DIAGNOSIS:  hematometra, chronic pelvic pain  POST-OPERATIVE DIAGNOSIS:  hematometra, chronic pelvic pain  PROCEDURE:  Procedure(s): HYSTERECTOMY, TOTAL, LAPAROSCOPIC, ROBOT-ASSISTED WITH SALPINGECTOMY (Bilateral) LYSIS, ADHESIONS, ROBOT-ASSISTED, LAPAROSCOPIC CYSTOSCOPY (N/A) REPAIR, BLADDER (N/A)  SURGEON:  Surgeons and Role: Panel 1:    * Kandyce Sor, MD - Primary    * Barbette Knock, MD Panel 2:    * Guadlupe Lianne DASEN, MD - Primary  PHYSICIAN ASSISTANT: None  ASSISTANTS: As above  ANESTHESIA:   local and general  EBL:  50 mL   BLOOD ADMINISTERED:none  DRAINS: Urinary Catheter (Foley)   LOCAL MEDICATIONS USED:  MARCAINE     and BUPIVICAINE   SPECIMEN:  Source of Specimen:  Uterus and cervix  DISPOSITION OF SPECIMEN:  PATHOLOGY  COUNTS:  YES  TOURNIQUET:  * No tourniquets in log *  DICTATION: .Note written in EPIC  PLAN OF CARE: Admit for overnight observation  PATIENT DISPOSITION:  PACU - hemodynamically stable.   Delay start of Pharmacological VTE agent (>24hrs) due to surgical blood loss or risk of bleeding: yes

## 2024-01-21 ENCOUNTER — Encounter (HOSPITAL_COMMUNITY): Payer: Self-pay | Admitting: Obstetrics

## 2024-01-21 DIAGNOSIS — N857 Hematometra: Secondary | ICD-10-CM | POA: Diagnosis not present

## 2024-01-21 LAB — CBC
HCT: 33 % — ABNORMAL LOW (ref 36.0–46.0)
Hemoglobin: 11.4 g/dL — ABNORMAL LOW (ref 12.0–15.0)
MCH: 30.3 pg (ref 26.0–34.0)
MCHC: 34.5 g/dL (ref 30.0–36.0)
MCV: 87.8 fL (ref 80.0–100.0)
Platelets: 173 K/uL (ref 150–400)
RBC: 3.76 MIL/uL — ABNORMAL LOW (ref 3.87–5.11)
RDW: 13.1 % (ref 11.5–15.5)
WBC: 6.4 K/uL (ref 4.0–10.5)
nRBC: 0 % (ref 0.0–0.2)

## 2024-01-21 MED ORDER — OXYCODONE HCL 5 MG PO TABS: 5.0000 mg | ORAL_TABLET | ORAL | 0 refills | Status: AC | PRN

## 2024-01-21 MED ORDER — URIBEL 118 MG PO CAPS: 1.0000 | ORAL_CAPSULE | Freq: Four times a day (QID) | ORAL | 0 refills | Status: AC

## 2024-01-21 MED ORDER — IBUPROFEN 600 MG PO TABS: 600.0000 mg | ORAL_TABLET | Freq: Four times a day (QID) | ORAL | 1 refills | Status: AC

## 2024-01-21 MED ORDER — ACETAMINOPHEN 500 MG PO TABS: 1000.0000 mg | ORAL_TABLET | Freq: Four times a day (QID) | ORAL | 1 refills | Status: AC

## 2024-01-21 NOTE — Plan of Care (Signed)
  Problem: Health Behavior/Discharge Planning: Goal: Ability to manage health-related needs will improve Outcome: Adequate for Discharge   Problem: Clinical Measurements: Goal: Ability to maintain clinical measurements within normal limits will improve Outcome: Adequate for Discharge Goal: Will remain free from infection Outcome: Adequate for Discharge Goal: Diagnostic test results will improve Outcome: Adequate for Discharge Goal: Respiratory complications will improve Outcome: Adequate for Discharge Goal: Cardiovascular complication will be avoided Outcome: Adequate for Discharge   Problem: Activity: Goal: Risk for activity intolerance will decrease Outcome: Adequate for Discharge   Problem: Nutrition: Goal: Adequate nutrition will be maintained Outcome: Adequate for Discharge   Problem: Coping: Goal: Level of anxiety will decrease Outcome: Adequate for Discharge   Problem: Elimination: Goal: Will not experience complications related to bowel motility Outcome: Adequate for Discharge Goal: Will not experience complications related to urinary retention Outcome: Adequate for Discharge   Problem: Pain Managment: Goal: General experience of comfort will improve and/or be controlled Outcome: Adequate for Discharge   Problem: Safety: Goal: Ability to remain free from injury will improve Outcome: Adequate for Discharge   Problem: Skin Integrity: Goal: Risk for impaired skin integrity will decrease Outcome: Adequate for Discharge   Problem: Education: Goal: Knowledge of the prescribed therapeutic regimen will improve Outcome: Adequate for Discharge Goal: Understanding of sexual limitations or changes related to disease process or condition will improve Outcome: Adequate for Discharge Goal: Individualized Educational Video(s) Outcome: Adequate for Discharge   Problem: Self-Concept: Goal: Communication of feelings regarding changes in body function or appearance will  improve Outcome: Adequate for Discharge   Problem: Skin Integrity: Goal: Demonstration of wound healing without infection will improve Outcome: Adequate for Discharge

## 2024-01-21 NOTE — Discharge Summary (Signed)
 Wendy Hicks MRN: [id number] DOB/AGE: 1980/11/07 43 y.o.  Admit date: 01/20/2024 Discharge date: January 21, 2024  Admission Diagnoses: Hematometra [N85.7]  Discharge Diagnoses: Hematometra [N85.7]        Principal Problem:   Hematometra Active Problems:   Intraoperative bladder injury   Discharged Condition: good  Hospital Course: Patient was admitted for robotic assisted total laparoscopic hysterectomy.  Due to adhesions around the bladder a partial-thickness bladder injury was recognized.  Urogynecology did cystoscopy x 2 with oversew of the denuded area of the bladder.  Patient kept a Foley catheter in.  On postop day 0 patient was noting bladder irritation from the Foley catheter which was resolved with oral Pyridium.  Vitals remained stable, urine output remained low.  Patient notes soreness more than pain and controlled with oral medications.  Patient is up without dizziness.  Patient notes no chest pain or shortness of breath.  Patient notes minimal vaginal bleeding.  Patient notes tolerating regular diet and passing flatus.  Patient expressed readiness for discharge expressed readiness for discharge.  Consults: Urogynecology  Treatments: surgery: Robotic assisted total laparoscopic hysterectomy, cystoscopy, robotic assisted 2 layer repair of the bladder  Disposition: Discharge disposition: 01-Home or Self Care        Allergies as of 01/21/2024       Reactions   Dilaudid [hydromorphone Hcl] Hives   Adhesive [tape] Rash   paper tape ok to use   Lexapro [escitalopram] Rash        Medication List     STOP taking these medications    amphetamine -dextroamphetamine  30 MG tablet Commonly known as: ADDERALL   cholecalciferol 25 MCG (1000 UNIT) tablet Commonly known as: VITAMIN D3   cyanocobalamin 1000 MCG tablet Commonly known as: VITAMIN B12   fexofenadine 180 MG tablet Commonly known as: ALLEGRA   meloxicam 7.5 MG tablet Commonly known as: MOBIC    Multivitamin Gummies Adult Chew   oxycodone  5 MG capsule Commonly known as: OXY-IR Replaced by: oxyCODONE  5 MG immediate release tablet   Wegovy  2.4 MG/0.75ML Soaj SQ injection Generic drug: semaglutide -weight management       TAKE these medications    acetaminophen  500 MG tablet Commonly known as: TYLENOL  Take 2 tablets (1,000 mg total) by mouth every 6 (six) hours.   ALPRAZolam  0.5 MG tablet Commonly known as: XANAX  Take 1 tablet (0.5 mg total) by mouth 2 (two) times daily as needed for anxiety What changed: reasons to take this   ibuprofen 600 MG tablet Commonly known as: ADVIL Take 1 tablet (600 mg total) by mouth every 6 (six) hours. What changed:  medication strength when to take this reasons to take this   oxyCODONE  5 MG immediate release tablet Commonly known as: Oxy IR/ROXICODONE  Take 1-2 tablets (5-10 mg total) by mouth every 4 (four) hours as needed for moderate pain (pain score 4-6). Replaces: oxycodone  5 MG capsule   Uribel 118 MG Caps Take 1 capsule (118 mg total) by mouth every 6 (six) hours for 5 days.   zolpidem 5 MG tablet Commonly known as: AMBIEN Take 5 mg by mouth at bedtime as needed for sleep.         Signed: Burnard DELENA Bowers, MD 01/21/2024, 9:05 AM

## 2024-01-24 LAB — SURGICAL PATHOLOGY
